# Patient Record
Sex: Female | Born: 1995 | Race: Black or African American | Hispanic: Refuse to answer | Marital: Single | Smoking: Current some day smoker
Health system: Southern US, Community
[De-identification: ages and names within clinical notes are randomized; demographics above are authoritative.]

## PROBLEM LIST (undated history)

## (undated) DIAGNOSIS — M419 Scoliosis, unspecified: Secondary | ICD-10-CM

## (undated) DIAGNOSIS — O24419 Gestational diabetes mellitus in pregnancy, unspecified control: Secondary | ICD-10-CM

## (undated) HISTORY — PX: NO PAST SURGERIES: SHX2092

## (undated) HISTORY — DX: Scoliosis, unspecified: M41.9

## (undated) HISTORY — DX: Gestational diabetes mellitus in pregnancy, unspecified control: O24.419

---

## 2016-04-05 DIAGNOSIS — B009 Herpesviral infection, unspecified: Secondary | ICD-10-CM

## 2016-04-05 HISTORY — DX: Herpesviral infection, unspecified: B00.9

## 2017-04-05 NOTE — L&D Delivery Note (Signed)
Delivery Note At 9:40 PM a viable and healthy female was delivered via Vaginal, Spontaneous (Presentation: ROA ).  APGAR: 8, 9; weight pending .   Placenta status: spontaneous, intact  .  Cord:  3 vessel with the following complications: none .    Anesthesia:  epidural Episiotomy:  none Lacerations:  none Suture Repair: n/a Est. Blood Loss (mL):  50   Mom to postpartum.  Baby to Couplet care / Skin to Skin.  Sierra Daniels is a 22 y.o. female G1P0 with IUP at [redacted]w[redacted]d admitted for IOL for GDM on metformin .  She progressed with augmentation (Cytotec, Foley Bulb, and Pitocin) to complete and pushed ~30 minutes to deliver.  Cord clamping delayed by 1-3 minutes then clamped by CNM and cut by pt father.  Placenta intact and spontaneous, bleeding minimal.  Intact perineum.  Mom and baby stable prior to transfer to postpartum. She plans on breastfeeding. She requests IUD for birth control.   Fatima Blank 01/27/2018, 9:56 PM

## 2017-07-08 ENCOUNTER — Encounter: Payer: Self-pay | Admitting: *Deleted

## 2017-08-16 ENCOUNTER — Encounter: Payer: Self-pay | Admitting: Obstetrics and Gynecology

## 2017-08-16 ENCOUNTER — Ambulatory Visit (INDEPENDENT_AMBULATORY_CARE_PROVIDER_SITE_OTHER): Payer: Medicaid Other | Admitting: Obstetrics and Gynecology

## 2017-08-16 ENCOUNTER — Other Ambulatory Visit (HOSPITAL_COMMUNITY)
Admission: RE | Admit: 2017-08-16 | Discharge: 2017-08-16 | Disposition: A | Discharge status: Home / Self Care | Payer: Medicaid Other | Source: Ambulatory Visit | Attending: Obstetrics and Gynecology | Admitting: Obstetrics and Gynecology

## 2017-08-16 ENCOUNTER — Ambulatory Visit (INDEPENDENT_AMBULATORY_CARE_PROVIDER_SITE_OTHER): Payer: Self-pay | Admitting: Clinical

## 2017-08-16 VITALS — BP 116/69 | HR 74 | Ht 67.0 in | Wt 133.1 lb

## 2017-08-16 DIAGNOSIS — F4321 Adjustment disorder with depressed mood: Secondary | ICD-10-CM

## 2017-08-16 DIAGNOSIS — Z3490 Encounter for supervision of normal pregnancy, unspecified, unspecified trimester: Secondary | ICD-10-CM | POA: Insufficient documentation

## 2017-08-16 DIAGNOSIS — Z3402 Encounter for supervision of normal first pregnancy, second trimester: Secondary | ICD-10-CM | POA: Diagnosis not present

## 2017-08-16 DIAGNOSIS — F329 Major depressive disorder, single episode, unspecified: Secondary | ICD-10-CM

## 2017-08-16 DIAGNOSIS — R4589 Other symptoms and signs involving emotional state: Secondary | ICD-10-CM | POA: Insufficient documentation

## 2017-08-16 LAB — POCT URINALYSIS DIP (DEVICE)
Bilirubin Urine: NEGATIVE
Glucose, UA: NEGATIVE mg/dL
HGB URINE DIPSTICK: NEGATIVE
KETONES UR: NEGATIVE mg/dL
Leukocytes, UA: NEGATIVE
Nitrite: NEGATIVE
PH: 5.5 (ref 5.0–8.0)
Protein, ur: NEGATIVE mg/dL
SPECIFIC GRAVITY, URINE: 1.025 (ref 1.005–1.030)
Urobilinogen, UA: 0.2 mg/dL (ref 0.0–1.0)

## 2017-08-16 NOTE — Progress Notes (Signed)
New OB Note  08/16/2017   CC:  Chief Complaint  Patient presents with  . Initial Prenatal Visit    Transfer of Care Patient: no  History of Present Illness: Sierra Daniels is a 22 y.o. G1P0 at [redacted]w[redacted]d by LMP being seen today for her first obstetrical visit. Her obstetrical history is significant for none. This was a planned pregnancy. Relationship with FOB: together; live seperate. Patient does intend to breast feed. Patient plans to do Depo for contraception after completion of pregnancy. Pregnancy history fully reviewed.  Her periods were: regular periods She was using no method when she conceived.  She has Negative signs or symptoms of nausea/vomiting of pregnancy. She has Negative signs or symptoms of miscarriage or preterm labor  Patient reports no complaints. Does stater that since finding out she was pregnant she has been more depressed. Denies SI/HI.  ROS: A 12-point review of systems was performed and negative, except as stated in the above HPI.   OBGYN History: OB History  Gravida Para Term Preterm AB Living  1         0  SAB TAB Ectopic Multiple Live Births               # Outcome Date GA Lbr Len/2nd Weight Sex Delivery Anes PTL Lv  1 Current             GYN Hx: none  Past Medical History: History reviewed. No pertinent past medical history.  Past Surgical History: History reviewed. No pertinent surgical history.  Family History:  History reviewed. No pertinent family history.  Social History:  Social History   Socioeconomic History  . Marital status: Single    Spouse name: Not on file  . Number of children: Not on file  . Years of education: Not on file  . Highest education level: Not on file  Occupational History  . Not on file  Social Needs  . Financial resource strain: Not on file  . Food insecurity:    Worry: Not on file    Inability: Not on file  . Transportation needs:    Medical: Not on file    Non-medical: Not on file  Tobacco Use    . Smoking status: Former Smoker    Types: Cigars    Last attempt to quit: 08/02/2017    Years since quitting: 0.0  . Smokeless tobacco: Never Used  Substance and Sexual Activity  . Alcohol use: Never    Frequency: Never  . Drug use: Never  . Sexual activity: Yes    Birth control/protection: Injection  Lifestyle  . Physical activity:    Days per week: Not on file    Minutes per session: Not on file  . Stress: Not on file  Relationships  . Social connections:    Talks on phone: Not on file    Gets together: Not on file    Attends religious service: Not on file    Active member of club or organization: Not on file    Attends meetings of clubs or organizations: Not on file    Relationship status: Not on file  . Intimate partner violence:    Fear of current or ex partner: Not on file    Emotionally abused: Not on file    Physically abused: Not on file    Forced sexual activity: Not on file  Other Topics Concern  . Not on file  Social History Narrative  . Not on file  Allergy: Allergies not on file  Current Outpatient Medications: No current outpatient medications on file.  Physical Exam:   BP 116/69   Pulse 74   Ht 5\' 7"  (1.702 m)   Wt 133 lb 1.6 oz (60.4 kg)   LMP 04/28/2017 (Exact Date)   BMI 20.85 kg/m  Body mass index is 20.85 kg/m. Contractions: Not present Vag. Bleeding: None. FHTs: 153  General appearance: Well nourished, well developed thin female in no acute distress.  Neck:  Supple, normal appearance, and no thyromegaly  Cardiovascular: normal rate and rhythm, pulses intact Respiratory:  Clear to auscultation bilateral. Normal respiratory effort Abdomen: positive bowel sounds and no masses, hernias; diffusely non tender to palpation, non distended Breasts: breasts appear normal, no suspicious masses, no skin or nipple changes or axillary nodes. Genitalia:  Normal introitus for age, no external lesions, no vaginal discharge, mucosa pink and moist,  no vaginal or cervical lesions, no vaginal atrophy, no friaility or hemorrhage, normal uterus size and position, no adnexal masses or tenderness Neuro/Psych:  Normal mood and affect.  Skin:  Warm and dry.  Lymphatic:  No inguinal lymphadenopathy.    Assessment/Plan: G1P0 [redacted]w[redacted]d  1. Encounter for supervision of low-risk pregnancy, antepartum - [facility] AMB BABYSCRIPTS SCHEDULE OPTIMIZATION - Culture, OB Urine - Cystic fibrosis gene test - Cytology - PAP - Genetic Screening - Hemoglobinopathy Evaluation - Obstetric Panel, Including HIV - SMN1 COPY NUMBER ANALYSIS (SMA Carrier Screen) - [place] MFM OB COMP + 14 WK; Future  2. Depressed mood Score today 11. Patient declines medication at this time. States her feelings of depression are more situational. Willing to meet with Roselyn Reef. Handoff given.   Initial labs drawn. Continue prenatal vitamins. Genetic Screening discussed, NIPS: ordered. Ultrasound discussed; fetal anatomic survey: ordered. Problem list reviewed and updated. The nature of [facility] with multiple MDs and other Advanced Practice Providers was explained to patient; also emphasized that residents, students are part of our team. Routine obstetric precautions reviewed. Return in about 1 month (around 09/13/2017) for ob visit.    Luiz Blare, DO OB Fellow Center for [facility], [facility]

## 2017-08-16 NOTE — Progress Notes (Addendum)
New ob packet given  Declined Flu vaccine  Anatomy US scheduled  Home Medicaid Form Completed

## 2017-08-16 NOTE — BH Specialist Note (Signed)
Integrated Behavioral Health Initial Visit  MRN: [id number] Name: Cereniti Curb  Number of [facility] Clinician visits:: 1/6 Session Start time: 9:49 Session End time: 10:20 Total time: 20 minutes  Type of Service: [place] Interpretor:No. Interpretor Name and Language: n/a   Warm Hand Off Completed.       SUBJECTIVE: Daurice Ovando is a 22 y.o. female accompanied by n/a Patient was referred by Dr Gerarda Fraction for depression Patient reports the following symptoms/concerns: Pt states her primary concern today is adjusting to a lack of sleep schedule after quitting her job, feeling irritable, and worry over mild feelings of depression. Pt is interested in talking about her feelings today and preventing depression postpartum.  Duration of problem: Current pregnancy; Severity of problem: moderate  OBJECTIVE: Mood: Normal and Affect: Appropriate Risk of harm to self orl: No plan to harm self or others  LIFE CONTEXT: Family and Social: Pt lives with her parents; FOB and parents are supportive School/Work: Looking for work Self-Care: - Life Changes: Current pregnancy; quit job  GOALS ADDRESSED: Patient will: 1. Reduce symptoms of: depression 2. Increase knowledge and/or ability of: coping skills  3. Demonstrate ability to: Increase healthy adjustment to current life circumstances  INTERVENTIONS: Interventions utilized: Solution-Focused Strategies and Psychoeducation and/or Health Education  Standardized Assessments completed: GAD-7 and PHQ 9  ASSESSMENT: Patient currently experiencing Adjustment disorder with depressed mood.   Patient may benefit from psychoeducation and brief therapeutic interventions regarding coping with symptoms of depression .  PLAN: 1. Follow up with behavioral health clinician on : One month 2. Behavioral recommendations:  -Continue using sleep sounds at night (same time every night) -Continue taking  prenatal vitamin daily and eating when hungry -Read educational materials regarding coping with symptoms of depression  3. Referral(s): [facility] (In Clinic) 4. "From scale of 1-10, how likely are you to follow plan?": 10  Garlan Fair, LCSW  Depression screen [facility] 2/9 08/16/2017  Decreased Interest 2  Down, Depressed, Hopeless 2  PHQ - 2 Score 4  Altered sleeping 3  Tired, decreased energy 1  Change in appetite 1  Feeling bad or failure about yourself  2  Trouble concentrating 0  Moving slowly or fidgety/restless 0  Suicidal thoughts 0  PHQ-9 Score 11   GAD 7 : Generalized Anxiety Score 08/16/2017  Nervous, Anxious, on Edge 0  Control/stop worrying 1  Worry too much - different things 1  Trouble relaxing 0  Restless 0  Easily annoyed or irritable 3  Afraid - awful might happen 0  Total GAD 7 Score 5

## 2017-08-16 NOTE — Patient Instructions (Signed)
Second Trimester of Pregnancy The second trimester is from week 13 through week 28, month 4 through 6. This is often the time in pregnancy that you feel your best. Often times, morning sickness has lessened or quit. You may have more energy, and you may get hungry more often. Your unborn baby (fetus) is growing rapidly. At the end of the sixth month, he or she is about 9 inches long and weighs about 1 pounds. You will likely feel the baby move (quickening) between 18 and 20 weeks of pregnancy. Follow these instructions at home:  Avoid all smoking, herbs, and alcohol. Avoid drugs not approved by your doctor.  Do not use any tobacco products, including cigarettes, chewing tobacco, and electronic cigarettes. If you need help quitting, ask your doctor. You may get counseling or other support to help you quit.  Only take medicine as told by your doctor. Some medicines are safe and some are not during pregnancy.  Exercise only as told by your doctor. Stop exercising if you start having cramps.  Eat regular, healthy meals.  Wear a good support bra if your breasts are tender.  Do not use hot tubs, steam rooms, or saunas.  Wear your seat belt when driving.  Avoid raw meat, uncooked cheese, and liter boxes and soil used by cats.  Take your prenatal vitamins.  Take 1500-2000 milligrams of calcium daily starting at the 20th week of pregnancy until you deliver your baby.  Try taking medicine that helps you poop (stool softener) as needed, and if your doctor approves. Eat more fiber by eating fresh fruit, vegetables, and whole grains. Drink enough fluids to keep your pee (urine) clear or pale yellow.  Take warm water baths (sitz baths) to soothe pain or discomfort caused by hemorrhoids. Use hemorrhoid cream if your doctor approves.  If you have puffy, bulging veins (varicose veins), wear support hose. Raise (elevate) your feet for 15 minutes, 3-4 times a day. Limit salt in your diet.  Avoid heavy  lifting, wear low heals, and sit up straight.  Rest with your legs raised if you have leg cramps or low back pain.  Visit your dentist if you have not gone during your pregnancy. Use a soft toothbrush to brush your teeth. Be gentle when you floss.  You can have sex (intercourse) unless your doctor tells you not to.  Go to your doctor visits. Get help if:  You feel dizzy.  You have mild cramps or pressure in your lower belly (abdomen).  You have a nagging pain in your belly area.  You continue to feel sick to your stomach (nauseous), throw up (vomit), or have watery poop (diarrhea).  You have bad smelling fluid coming from your vagina.  You have pain with peeing (urination). Get help right away if:  You have a fever.  You are leaking fluid from your vagina.  You have spotting or bleeding from your vagina.  You have severe belly cramping or pain.  You lose or gain weight rapidly.  You have trouble catching your breath and have chest pain.  You notice sudden or extreme puffiness (swelling) of your face, hands, ankles, feet, or legs.  You have not felt the baby move in over an hour.  You have severe headaches that do not go away with medicine.  You have vision changes. This information is not intended to replace advice given to you by your health care provider. Make sure you discuss any questions you have with your health care  provider. Document Released: 06/16/2009 Document Revised: 08/28/2015 Document Reviewed: 05/23/2012 Elsevier Interactive Patient Education  2017 [employer]???  You must attend a Doren Custard class at [facility]  3rd Wednesday of every month from [employer] by calling [phone] or online at [email or web address].at  Bring [place] the certificate from the class  Doren Custard supplies needed for [facility] Department patients:  Our practice has a Heritage manager in a Box tub at the hospital that  you can borrow  You will need to purchase an accessory kit that has all needed supplies through [facility] [phone]) or online $175.00  Or you can purchase the supplies separately: o Single-use disposable tub liner for Birth Pool in a Box (REGULAR size) o New garden hose labeled "lead-free", "suitable for drinking water", o Electric drain pump to remove water (We recommend 792 gallon per hour or greater pump.)  o  "non-toxic" OR "water potable" o Garden hose to remove the dirty water o Fish net o Bathing suit top (optional) o Long-handled mirror (optional)  [employer] sells tubs for ~ $120 if you would rather purchase your own tub.  They also sell accessories, liners.    Www.waterbirthsolutions.com for tub purchases and supplies  The Labor Ladies (www.thelaborladies.com) $275 for tub rental/set-up & take down/kit   [employer] information regarding doulas (labor support) who provide pool rentals:  IdentityList.se.htm   The Labor Ladies (www.thelaborladies.com)  IdentityList.se.htm   Things that would prevent you from having a waterbirth:  Premature, <37wks  Previous cesarean birth  Presence of thick meconium-stained fluid  Multiple gestation (Twins, triplets, etc.)  Uncontrolled diabetes or gestational diabetes requiring medication  Hypertension  Heavy vaginal bleeding  Non-reassuring fetal heart rate  Active infection (MRSA, etc.)  If your labor has to be induced and induction method requires continuous monitoring of the baby's heart rate  Other risks/issues identified by your obstetrical provider

## 2017-08-17 LAB — CYTOLOGY - PAP
CHLAMYDIA, DNA PROBE: NEGATIVE
Diagnosis: NEGATIVE
Neisseria Gonorrhea: NEGATIVE

## 2017-08-19 LAB — CULTURE, OB URINE

## 2017-08-19 LAB — URINE CULTURE, OB REFLEX

## 2017-08-22 LAB — SMN1 COPY NUMBER ANALYSIS (SMA CARRIER SCREENING)

## 2017-08-22 LAB — OBSTETRIC PANEL, INCLUDING HIV
Antibody Screen: NEGATIVE
Basophils Absolute: 0 10*3/uL (ref 0.0–0.2)
Basos: 0 %
EOS (ABSOLUTE): 0.1 10*3/uL (ref 0.0–0.4)
EOS: 2 %
HEMOGLOBIN: 12 g/dL (ref 11.1–15.9)
HEP B S AG: NEGATIVE
HIV SCREEN 4TH GENERATION: NONREACTIVE
Hematocrit: 35.9 % (ref 34.0–46.6)
IMMATURE GRANULOCYTES: 0 %
Immature Grans (Abs): 0 10*3/uL (ref 0.0–0.1)
Lymphocytes Absolute: 1.8 10*3/uL (ref 0.7–3.1)
Lymphs: 33 %
MCH: 28.8 pg (ref 26.6–33.0)
MCHC: 33.4 g/dL (ref 31.5–35.7)
MCV: 86 fL (ref 79–97)
MONOS ABS: 0.4 10*3/uL (ref 0.1–0.9)
Monocytes: 8 %
NEUTROS ABS: 3.1 10*3/uL (ref 1.4–7.0)
NEUTROS PCT: 57 %
PLATELETS: 217 10*3/uL (ref 150–379)
RBC: 4.16 x10E6/uL (ref 3.77–5.28)
RDW: 13.3 % (ref 12.3–15.4)
RH TYPE: POSITIVE
RPR: NONREACTIVE
Rubella Antibodies, IGG: 21.4 index (ref >0.99)
WBC: 5.5 10*3/uL (ref 3.4–10.8)

## 2017-08-22 LAB — HEMOGLOBINOPATHY EVALUATION
Ferritin: 25 ng/mL (ref 15–150)
HGB A2 QUANT: 2.5 % (ref 1.8–3.2)
HGB F QUANT: 0 % (ref 0.0–2.0)
HGB S: 0 %
HGB VARIANT: 0 %
Hgb A: 97.5 % (ref 96.4–98.8)
Hgb C: 0 %
Hgb Solubility: NEGATIVE

## 2017-08-23 LAB — CYSTIC FIBROSIS GENE TEST

## 2017-08-26 ENCOUNTER — Encounter: Payer: Self-pay | Admitting: *Deleted

## 2017-08-30 ENCOUNTER — Encounter: Payer: Self-pay | Admitting: *Deleted

## 2017-09-01 ENCOUNTER — Encounter (HOSPITAL_COMMUNITY): Payer: Self-pay

## 2017-09-09 ENCOUNTER — Ambulatory Visit (HOSPITAL_COMMUNITY)
Admission: RE | Admit: 2017-09-09 | Discharge: 2017-09-09 | Disposition: A | Discharge status: Home / Self Care | Payer: Medicaid Other | Source: Ambulatory Visit | Attending: Obstetrics and Gynecology | Admitting: Obstetrics and Gynecology

## 2017-09-09 ENCOUNTER — Other Ambulatory Visit: Payer: Self-pay | Admitting: Obstetrics and Gynecology

## 2017-09-09 DIAGNOSIS — Z3490 Encounter for supervision of normal pregnancy, unspecified, unspecified trimester: Secondary | ICD-10-CM

## 2017-09-09 DIAGNOSIS — D259 Leiomyoma of uterus, unspecified: Secondary | ICD-10-CM | POA: Diagnosis not present

## 2017-09-09 DIAGNOSIS — O3412 Maternal care for benign tumor of corpus uteri, second trimester: Secondary | ICD-10-CM | POA: Diagnosis not present

## 2017-09-09 DIAGNOSIS — Z3A19 19 weeks gestation of pregnancy: Secondary | ICD-10-CM | POA: Insufficient documentation

## 2017-09-09 DIAGNOSIS — Z363 Encounter for antenatal screening for malformations: Secondary | ICD-10-CM

## 2017-09-13 ENCOUNTER — Ambulatory Visit (INDEPENDENT_AMBULATORY_CARE_PROVIDER_SITE_OTHER): Payer: Medicaid Other | Admitting: Nurse Practitioner

## 2017-09-13 VITALS — BP 113/61 | HR 88 | Wt 143.6 lb

## 2017-09-13 DIAGNOSIS — Z3402 Encounter for supervision of normal first pregnancy, second trimester: Secondary | ICD-10-CM

## 2017-09-13 DIAGNOSIS — Z3490 Encounter for supervision of normal pregnancy, unspecified, unspecified trimester: Secondary | ICD-10-CM

## 2017-09-13 MED ORDER — VITAFOL GUMMIES 3.33-0.333-34.8 MG PO CHEW: 3.0000 | CHEWABLE_TABLET | Freq: Every day | ORAL | 11 refills | Status: AC

## 2017-09-13 NOTE — Progress Notes (Signed)
    Subjective:  Sierra Daniels is a 22 y.o. G1P0 at [redacted]w[redacted]d being seen today for ongoing prenatal care.  She is currently monitored for the following issues for this low-risk pregnancy and has Supervision of low-risk pregnancy and Depressed mood on their problem list.    Patient reports no complaints.  Contractions: Not present. Vag. Bleeding: None.  Movement: Present. Denies leaking of fluid.   Has vomited occasionally but not daily.  The following portions of the patient's history were reviewed and updated as appropriate: allergies, current medications, past family history, past medical history, past social history, past surgical history and problem list. Problem list updated.  Objective:   Vitals:   09/13/17 1647  BP: 113/61  Pulse: 88  Weight: 143 lb 9.6 oz (65.1 kg)    Fetal Status: Fetal Heart Rate (bpm): 160 Fundal Height: 23 cm Movement: Present     General:  Alert, oriented and cooperative. Patient is in no acute distress.  Skin: Skin is warm and dry. No rash noted.   Cardiovascular: Normal heart rate noted  Respiratory: Normal respiratory effort, no problems with respiration noted  Abdomen: Soft, gravid, appropriate for gestational age. Pain/Pressure: Present     Pelvic:  Cervical exam deferred        Extremities: Normal range of motion.  Edema: None  Mental Status: Normal mood and affect. Normal behavior. Normal judgment and thought content.   Urinalysis:      Assessment and Plan:  Pregnancy: G1P0 at [redacted]w[redacted]d  1. Encounter for supervision of low-risk pregnancy, antepartum Refilled prenatal vitamins Discussed eating every 3-4 hours to avoid nausea and vomiting. Drink at least 8 8-oz glasses of water every day. Has list of medications that she can take in pregnancy  Preterm labor symptoms and general obstetric precautions including but not limited to vaginal bleeding, contractions, leaking of fluid and fetal movement were reviewed in detail with the patient. Please refer to  After Visit Summary for other counseling recommendations.  Return in about 1 month (around 10/11/2017).  Earlie Server, RN, MSN, NP-BC Nurse Practitioner, [facility] for [employer], [place] [facility] 09/13/2017 5:09 PM

## 2017-09-13 NOTE — Patient Instructions (Signed)
Second Trimester of Pregnancy The second trimester is from week 13 through week 28, month 4 through 6. This is often the time in pregnancy that you feel your best. Often times, morning sickness has lessened or quit. You may have more energy, and you may get hungry more often. Your unborn baby (fetus) is growing rapidly. At the end of the sixth month, he or she is about 9 inches long and weighs about 1 pounds. You will likely feel the baby move (quickening) between 18 and 20 weeks of pregnancy. Follow these instructions at home:  Avoid all smoking, herbs, and alcohol. Avoid drugs not approved by your doctor.  Do not use any tobacco products, including cigarettes, chewing tobacco, and electronic cigarettes. If you need help quitting, ask your doctor. You may get counseling or other support to help you quit.  Only take medicine as told by your doctor. Some medicines are safe and some are not during pregnancy.  Exercise only as told by your doctor. Stop exercising if you start having cramps.  Eat regular, healthy meals.  Wear a good support bra if your breasts are tender.  Do not use hot tubs, steam rooms, or saunas.  Wear your seat belt when driving.  Avoid raw meat, uncooked cheese, and liter boxes and soil used by cats.  Take your prenatal vitamins.  Take 1500-2000 milligrams of calcium daily starting at the 20th week of pregnancy until you deliver your baby.  Try taking medicine that helps you poop (stool softener) as needed, and if your doctor approves. Eat more fiber by eating fresh fruit, vegetables, and whole grains. Drink enough fluids to keep your pee (urine) clear or pale yellow.  Take warm water baths (sitz baths) to soothe pain or discomfort caused by hemorrhoids. Use hemorrhoid cream if your doctor approves.  If you have puffy, bulging veins (varicose veins), wear support hose. Raise (elevate) your feet for 15 minutes, 3-4 times a day. Limit salt in your diet.  Avoid heavy  lifting, wear low heals, and sit up straight.  Rest with your legs raised if you have leg cramps or low back pain.  Visit your dentist if you have not gone during your pregnancy. Use a soft toothbrush to brush your teeth. Be gentle when you floss.  You can have sex (intercourse) unless your doctor tells you not to.  Go to your doctor visits. Get help if:  You feel dizzy.  You have mild cramps or pressure in your lower belly (abdomen).  You have a nagging pain in your belly area.  You continue to feel sick to your stomach (nauseous), throw up (vomit), or have watery poop (diarrhea).  You have bad smelling fluid coming from your vagina.  You have pain with peeing (urination). Get help right away if:  You have a fever.  You are leaking fluid from your vagina.  You have spotting or bleeding from your vagina.  You have severe belly cramping or pain.  You lose or gain weight rapidly.  You have trouble catching your breath and have chest pain.  You notice sudden or extreme puffiness (swelling) of your face, hands, ankles, feet, or legs.  You have not felt the baby move in over an hour.  You have severe headaches that do not go away with medicine.  You have vision changes. This information is not intended to replace advice given to you by your health care provider. Make sure you discuss any questions you have with your health care   provider. Document Released: 06/16/2009 Document Revised: 08/28/2015 Document Reviewed: 05/23/2012 Elsevier Interactive Patient Education  2017 Elsevier Inc.  

## 2017-09-14 ENCOUNTER — Telehealth: Payer: Self-pay

## 2017-09-14 NOTE — Telephone Encounter (Signed)
Called patient - advised [place] MFM Follow up scheduled for 10/10/17 @ 8 am

## 2017-10-10 ENCOUNTER — Ambulatory Visit (HOSPITAL_COMMUNITY): Admission: RE | Admit: 2017-10-10 | Payer: Medicaid Other | Source: Ambulatory Visit

## 2017-10-13 ENCOUNTER — Other Ambulatory Visit: Payer: Self-pay | Admitting: Nurse Practitioner

## 2017-10-13 ENCOUNTER — Ambulatory Visit (HOSPITAL_COMMUNITY)
Admission: RE | Admit: 2017-10-13 | Discharge: 2017-10-13 | Disposition: A | Discharge status: Home / Self Care | Payer: Medicaid Other | Source: Ambulatory Visit | Attending: Nurse Practitioner | Admitting: Nurse Practitioner

## 2017-10-13 DIAGNOSIS — Z3A24 24 weeks gestation of pregnancy: Secondary | ICD-10-CM

## 2017-10-13 DIAGNOSIS — Z362 Encounter for other antenatal screening follow-up: Secondary | ICD-10-CM

## 2017-10-13 DIAGNOSIS — Z0489 Encounter for examination and observation for other specified reasons: Secondary | ICD-10-CM

## 2017-10-13 DIAGNOSIS — Z3490 Encounter for supervision of normal pregnancy, unspecified, unspecified trimester: Secondary | ICD-10-CM

## 2017-10-13 DIAGNOSIS — IMO0002 Reserved for concepts with insufficient information to code with codable children: Secondary | ICD-10-CM

## 2017-11-16 ENCOUNTER — Ambulatory Visit: Payer: Medicaid Other | Admitting: Medical

## 2017-11-17 ENCOUNTER — Encounter: Payer: Self-pay | Admitting: Medical

## 2017-11-17 ENCOUNTER — Encounter: Payer: Self-pay | Admitting: Obstetrics and Gynecology

## 2017-11-17 ENCOUNTER — Ambulatory Visit (INDEPENDENT_AMBULATORY_CARE_PROVIDER_SITE_OTHER): Payer: Medicaid Other | Admitting: Obstetrics and Gynecology

## 2017-11-17 ENCOUNTER — Telehealth: Payer: Self-pay | Admitting: Medical

## 2017-11-17 VITALS — BP 118/66 | HR 75 | Wt 167.0 lb

## 2017-11-17 DIAGNOSIS — Z3490 Encounter for supervision of normal pregnancy, unspecified, unspecified trimester: Secondary | ICD-10-CM

## 2017-11-17 DIAGNOSIS — Z23 Encounter for immunization: Secondary | ICD-10-CM

## 2017-11-17 DIAGNOSIS — Z3492 Encounter for supervision of normal pregnancy, unspecified, second trimester: Secondary | ICD-10-CM

## 2017-11-17 NOTE — Patient Instructions (Signed)
Third Trimester of Pregnancy The third trimester is from week 28 through week 40 (months 7 through 9). The third trimester is a time when the unborn baby (fetus) is growing rapidly. At the end of the ninth month, the fetus is about 20 inches in length and weighs 6-10 pounds. Body changes during your third trimester Your body will continue to go through many changes during pregnancy. The changes vary from woman to woman. During the third trimester:  Your weight will continue to increase. You can expect to gain 25-35 pounds (11-16 kg) by the end of the pregnancy.  You may begin to get stretch marks on your hips, abdomen, and breasts.  You may urinate more often because the fetus is moving lower into your pelvis and pressing on your bladder.  You may develop or continue to have heartburn. This is caused by increased hormones that slow down muscles in the digestive tract.  You may develop or continue to have constipation because increased hormones slow digestion and cause the muscles that push waste through your intestines to relax.  You may develop hemorrhoids. These are swollen veins (varicose veins) in the rectum that can itch or be painful.  You may develop swollen, bulging veins (varicose veins) in your legs.  You may have increased body aches in the pelvis, back, or thighs. This is due to weight gain and increased hormones that are relaxing your joints.  You may have changes in your hair. These can include thickening of your hair, rapid growth, and changes in texture. Some women also have hair loss during or after pregnancy, or hair that feels dry or thin. Your hair will most likely return to normal after your baby is born.  Your breasts will continue to grow and they will continue to become tender. A yellow fluid (colostrum) may leak from your breasts. This is the first milk you are producing for your baby.  Your belly button may stick out.  You may notice more swelling in your hands,  face, or ankles.  You may have increased tingling or numbness in your hands, arms, and legs. The skin on your belly may also feel numb.  You may feel short of breath because of your expanding uterus.  You may have more problems sleeping. This can be caused by the size of your belly, increased need to urinate, and an increase in your body's metabolism.  You may notice the fetus "dropping," or moving lower in your abdomen (lightening).  You may have increased vaginal discharge.  You may notice your joints feel loose and you may have pain around your pelvic bone.  What to expect at prenatal visits You will have prenatal exams every 2 weeks until week 36. Then you will have weekly prenatal exams. During a routine prenatal visit:  You will be weighed to make sure you and the baby are growing normally.  Your blood pressure will be taken.  Your abdomen will be measured to track your baby's growth.  The fetal heartbeat will be listened to.  Any test results from the previous visit will be discussed.  You may have a cervical check near your due date to see if your cervix has softened or thinned (effaced).  You will be tested for Group B streptococcus. This happens between 35 and 37 weeks.  Your health care provider may ask you:  What your birth plan is.  How you are feeling.  If you are feeling the baby move.  If you have had   any abnormal symptoms, such as leaking fluid, bleeding, severe headaches, or abdominal cramping.  If you are using any tobacco products, including cigarettes, chewing tobacco, and electronic cigarettes.  If you have any questions.  Other tests or screenings that may be performed during your third trimester include:  Blood tests that check for low iron levels (anemia).  Fetal testing to check the health, activity level, and growth of the fetus. Testing is done if you have certain medical conditions or if there are problems during the  pregnancy.  Nonstress test (NST). This test checks the health of your baby to make sure there are no signs of problems, such as the baby not getting enough oxygen. During this test, a belt is placed around your belly. The baby is made to move, and its heart rate is monitored during movement.  What is false labor? False labor is a condition in which you feel small, irregular tightenings of the muscles in the womb (contractions) that usually go away with rest, changing position, or drinking water. These are called Braxton Hicks contractions. Contractions may last for hours, days, or even weeks before true labor sets in. If contractions come at regular intervals, become more frequent, increase in intensity, or become painful, you should see your health care provider. What are the signs of labor?  Abdominal cramps.  Regular contractions that start at 10 minutes apart and become stronger and more frequent with time.  Contractions that start on the top of the uterus and spread down to the lower abdomen and back.  Increased pelvic pressure and dull back pain.  A watery or bloody mucus discharge that comes from the vagina.  Leaking of amniotic fluid. This is also known as your "water breaking." It could be a slow trickle or a gush. Let your health care provider know if it has a color or strange odor. If you have any of these signs, call your health care provider right away, even if it is before your due date. Follow these instructions at home: Medicines  Follow your health care provider's instructions regarding medicine use. Specific medicines may be either safe or unsafe to take during pregnancy.  Take a prenatal vitamin that contains at least 600 micrograms (mcg) of folic acid.  If you develop constipation, try taking a stool softener if your health care provider approves. Eating and drinking  Eat a balanced diet that includes fresh fruits and vegetables, whole grains, good sources of protein  such as meat, eggs, or tofu, and low-fat dairy. Your health care provider will help you determine the amount of weight gain that is right for you.  Avoid raw meat and uncooked cheese. These carry germs that can cause birth defects in the baby.  If you have low calcium intake from food, talk to your health care provider about whether you should take a daily calcium supplement.  Eat four or five small meals rather than three large meals a day.  Limit foods that are high in fat and processed sugars, such as fried and sweet foods.  To prevent constipation: ? Drink enough fluid to keep your urine clear or pale yellow. ? Eat foods that are high in fiber, such as fresh fruits and vegetables, whole grains, and beans. Activity  Exercise only as directed by your health care provider. Most women can continue their usual exercise routine during pregnancy. Try to exercise for 30 minutes at least 5 days a week. Stop exercising if you experience uterine contractions.  Avoid heavy   lifting.  Do not exercise in extreme heat or humidity, or at high altitudes.  Wear low-heel, comfortable shoes.  Practice good posture.  You may continue to have sex unless your health care provider tells you otherwise. Relieving pain and discomfort  Take frequent breaks and rest with your legs elevated if you have leg cramps or low back pain.  Take warm sitz baths to soothe any pain or discomfort caused by hemorrhoids. Use hemorrhoid cream if your health care provider approves.  Wear a good support bra to prevent discomfort from breast tenderness.  If you develop varicose veins: ? Wear support pantyhose or compression stockings as told by your healthcare provider. ? Elevate your feet for 15 minutes, 3-4 times a day. Prenatal care  Write down your questions. Take them to your prenatal visits.  Keep all your prenatal visits as told by your health care provider. This is important. Safety  Wear your seat belt at  all times when driving.  Make a list of emergency phone numbers, including numbers for family, friends, the hospital, and police and fire departments. General instructions  Avoid cat litter boxes and soil used by cats. These carry germs that can cause birth defects in the baby. If you have a cat, ask someone to clean the litter box for you.  Do not travel far distances unless it is absolutely necessary and only with the approval of your health care provider.  Do not use hot tubs, steam rooms, or saunas.  Do not drink alcohol.  Do not use any products that contain nicotine or tobacco, such as cigarettes and e-cigarettes. If you need help quitting, ask your health care provider.  Do not use any medicinal herbs or unprescribed drugs. These chemicals affect the formation and growth of the baby.  Do not douche or use tampons or scented sanitary pads.  Do not cross your legs for long periods of time.  To prepare for the arrival of your baby: ? Take prenatal classes to understand, practice, and ask questions about labor and delivery. ? Make a trial run to the hospital. ? Visit the hospital and tour the maternity area. ? Arrange for maternity or paternity leave through employers. ? Arrange for family and friends to take care of pets while you are in the hospital. ? Purchase a rear-facing car seat and make sure you know how to install it in your car. ? Pack your hospital bag. ? Prepare the baby's nursery. Make sure to remove all pillows and stuffed animals from the baby's crib to prevent suffocation.  Visit your dentist if you have not gone during your pregnancy. Use a soft toothbrush to brush your teeth and be gentle when you floss. Contact a health care provider if:  You are unsure if you are in labor or if your water has broken.  You become dizzy.  You have mild pelvic cramps, pelvic pressure, or nagging pain in your abdominal area.  You have lower back pain.  You have persistent  nausea, vomiting, or diarrhea.  You have an unusual or bad smelling vaginal discharge.  You have pain when you urinate. Get help right away if:  Your water breaks before 37 weeks.  You have regular contractions less than 5 minutes apart before 37 weeks.  You have a fever.  You are leaking fluid from your vagina.  You have spotting or bleeding from your vagina.  You have severe abdominal pain or cramping.  You have rapid weight loss or weight gain.    You have shortness of breath with chest pain.  You notice sudden or extreme swelling of your face, hands, ankles, feet, or legs.  Your baby makes fewer than 10 movements in 2 hours.  You have severe headaches that do not go away when you take medicine.  You have vision changes. Summary  The third trimester is from week 28 through week 40, months 7 through 9. The third trimester is a time when the unborn baby (fetus) is growing rapidly.  During the third trimester, your discomfort may increase as you and your baby continue to gain weight. You may have abdominal, leg, and back pain, sleeping problems, and an increased need to urinate.  During the third trimester your breasts will keep growing and they will continue to become tender. A yellow fluid (colostrum) may leak from your breasts. This is the first milk you are producing for your baby.  False labor is a condition in which you feel small, irregular tightenings of the muscles in the womb (contractions) that eventually go away. These are called Braxton Hicks contractions. Contractions may last for hours, days, or even weeks before true labor sets in.  Signs of labor can include: abdominal cramps; regular contractions that start at 10 minutes apart and become stronger and more frequent with time; watery or bloody mucus discharge that comes from the vagina; increased pelvic pressure and dull back pain; and leaking of amniotic fluid. This information is not intended to replace advice  given to you by your health care provider. Make sure you discuss any questions you have with your health care provider. Document Released: 03/16/2001 Document Revised: 08/28/2015 Document Reviewed: 05/23/2012 Elsevier Interactive Patient Education  2017 Elsevier Inc.  

## 2017-11-17 NOTE — Progress Notes (Signed)
   PRENATAL VISIT NOTE  Subjective:  Sierra Daniels is a 22 y.o. G1P0 at [redacted]w[redacted]d being seen today for ongoing prenatal care.  She is currently monitored for the following issues for this low-risk pregnancy and has Supervision of low-risk pregnancy and Depressed mood on their problem list.  Patient reports no complaints.  Contractions: Not present. Vag. Bleeding: None.  Movement: Present. Denies leaking of fluid.   The following portions of the patient's history were reviewed and updated as appropriate: allergies, current medications, past family history, past medical history, past social history, past surgical history and problem list. Problem list updated.  Objective:   Vitals:   11/17/17 1610  BP: 118/66  Pulse: 75  Weight: 167 lb (75.8 kg)    Fetal Status: Fetal Heart Rate (bpm): 159   Movement: Present     General:  Alert, oriented and cooperative. Patient is in no acute distress.  Skin: Skin is warm and dry. No rash noted.   Cardiovascular: Normal heart rate noted  Respiratory: Normal respiratory effort, no problems with respiration noted  Abdomen: Soft, gravid, appropriate for gestational age.  Pain/Pressure: Present     Pelvic: Cervical exam deferred        Extremities: Normal range of motion.  Edema: Trace  Mental Status: Normal mood and affect. Normal behavior. Normal judgment and thought content.   Assessment and Plan:  Pregnancy: G1P0 at [redacted]w[redacted]d  1. Need for Tdap vaccination  - Tdap vaccine greater than or equal to 7yo IM  2. Encounter for supervision of low-risk pregnancy, antepartum Lapsed PNC. Not fasting. Will return this week for 2hr OGTT. Reviewed schedule for visits and pregnnacy precautions.   Preterm labor symptoms and general obstetric precautions including but not limited to vaginal bleeding, contractions, leaking of fluid and fetal movement were reviewed in detail with the patient. Please refer to After Visit Summary for other counseling recommendations.    Return in about 2 weeks (around 12/01/2017).  Future Appointments  Date Time Provider [place]  11/22/2017  8:20 AM [facility]    Khadeja Abt [place], [place]

## 2017-11-17 NOTE — Progress Notes (Signed)
Patient left without being seen. Did not tell anyone and was not seen by provider or RN.  Luvenia Redden, PA-C 11/17/2017 8:13 AM

## 2017-11-17 NOTE — Telephone Encounter (Signed)
Called patient to get her rescheduled for her appointment. I asked her why did she leave on 11/16/2017 without being seen. She stated she had something to do. I have rescheduled her for 11/17/2017.

## 2017-11-21 ENCOUNTER — Other Ambulatory Visit: Payer: Self-pay

## 2017-11-21 DIAGNOSIS — Z3490 Encounter for supervision of normal pregnancy, unspecified, unspecified trimester: Secondary | ICD-10-CM

## 2017-11-22 ENCOUNTER — Other Ambulatory Visit: Payer: Medicaid Other

## 2017-11-22 DIAGNOSIS — Z3490 Encounter for supervision of normal pregnancy, unspecified, unspecified trimester: Secondary | ICD-10-CM

## 2017-11-23 LAB — CBC
HEMATOCRIT: 36.6 % (ref 34.0–46.6)
Hemoglobin: 12 g/dL (ref 11.1–15.9)
MCH: 28.4 pg (ref 26.6–33.0)
MCHC: 32.8 g/dL (ref 31.5–35.7)
MCV: 87 fL (ref 79–97)
Platelets: 298 10*3/uL (ref 150–450)
RBC: 4.23 x10E6/uL (ref 3.77–5.28)
RDW: 13.2 % (ref 12.3–15.4)
WBC: 8.9 10*3/uL (ref 3.4–10.8)

## 2017-11-23 LAB — HIV ANTIBODY (ROUTINE TESTING W REFLEX): HIV Screen 4th Generation wRfx: NONREACTIVE

## 2017-11-23 LAB — GLUCOSE TOLERANCE, 2 HOURS W/ 1HR
GLUCOSE, 1 HOUR: 157 mg/dL (ref 65–179)
GLUCOSE, 2 HOUR: 116 mg/dL (ref 65–152)
GLUCOSE, FASTING: 111 mg/dL — AB (ref 65–91)

## 2017-11-23 LAB — RPR: RPR: NONREACTIVE

## 2017-11-28 ENCOUNTER — Other Ambulatory Visit: Payer: Self-pay | Admitting: *Deleted

## 2017-11-28 DIAGNOSIS — O24419 Gestational diabetes mellitus in pregnancy, unspecified control: Secondary | ICD-10-CM

## 2017-11-28 MED ORDER — ACCU-CHEK FASTCLIX LANCETS MISC: 12 refills | Status: DC

## 2017-11-28 MED ORDER — ACCU-CHEK FASTCLIX LANCET KIT: PACK | 0 refills | Status: DC

## 2017-11-28 MED ORDER — GLUCOSE BLOOD VI STRP: ORAL_STRIP | 12 refills | Status: DC

## 2017-11-28 MED ORDER — ACCU-CHEK COMPACT PLUS CARE KIT: PACK | 0 refills | Status: DC

## 2017-12-01 ENCOUNTER — Encounter: Payer: Medicaid Other | Attending: Obstetrics & Gynecology | Admitting: *Deleted

## 2017-12-01 ENCOUNTER — Ambulatory Visit: Payer: Medicaid Other | Admitting: *Deleted

## 2017-12-01 DIAGNOSIS — Z713 Dietary counseling and surveillance: Secondary | ICD-10-CM | POA: Diagnosis not present

## 2017-12-01 DIAGNOSIS — O24419 Gestational diabetes mellitus in pregnancy, unspecified control: Secondary | ICD-10-CM | POA: Insufficient documentation

## 2017-12-01 NOTE — Progress Notes (Signed)
  Patient was seen on 12/01/2017 for Gestational Diabetes self-management. EDD 02/02/2018. Patient states no history of GDM. Significant other, Marjory Lies, here and he participated in the visit. Diet history obtained. Patient eats fair variety of all food groups with larger than average portion sizes and frequent high calorie foods. Beverages include fruit punch, regular sodas @ several each day.  The following learning objectives were met by the patient :   States the definition of Gestational Diabetes  States why dietary management is important in controlling blood glucose  Describes the effects of carbohydrates on blood glucose levels  Demonstrates ability to create a balanced meal plan  Demonstrates carbohydrate counting   States when to check blood glucose levels  Demonstrates proper blood glucose monitoring techniques  States the effect of stress and exercise on blood glucose levels  States the importance of limiting caffeine and abstaining from alcohol and smoking  Plan:  Aim for 3-4 Carb Choices per meal (45 - 60grams)  Aim for 1-2 Carbs per snack Begin reading food labels for Total Carbohydrate of foods If OK with your MD, consider  increasing your activity level by walking, Arm Chair Exercises or other activity daily as tolerated Begin checking BG before breakfast and 2 hours after first bite of breakfast, lunch and dinner as directed by MD  Bring Log Book/Sheet to every medical appointment or use Baby Scripts Baby Scripts:  Patient was introduced to [employer] and plans to use as record of BG electronically  Take medication if directed by MD  Blood glucose monitor Rx called into pharmacy by MD office a few days ago. I informed patient that she can go pick it up now: Accu Check Guide with Fast Clix drums Patient instructed to test pre breakfast and 2 hours each meal as directed by MD  Patient instructed to monitor glucose levels: FBS: 60 - 95 mg/dl 2 hour: <120  mg/dl  Patient received the following handouts:  Nutrition Diabetes and Pregnancy  Carbohydrate Counting List  BG Log Sheet  Patient will be seen for follow-up as needed.

## 2017-12-04 DIAGNOSIS — O24415 Gestational diabetes mellitus in pregnancy, controlled by oral hypoglycemic drugs: Secondary | ICD-10-CM | POA: Insufficient documentation

## 2017-12-08 ENCOUNTER — Ambulatory Visit (INDEPENDENT_AMBULATORY_CARE_PROVIDER_SITE_OTHER): Payer: Medicaid Other | Admitting: Student

## 2017-12-08 DIAGNOSIS — Z3493 Encounter for supervision of normal pregnancy, unspecified, third trimester: Secondary | ICD-10-CM

## 2017-12-08 DIAGNOSIS — O24410 Gestational diabetes mellitus in pregnancy, diet controlled: Secondary | ICD-10-CM

## 2017-12-08 NOTE — Patient Instructions (Addendum)
Tips for Eating Away From Home If You Have Diabetes Controlling your level of blood glucose, also known as blood sugar, can be challenging. It can be even more difficult when you do not prepare your own meals. The following tips can help you manage your diabetes when you eat away from home. Planning ahead Plan ahead if you know you will be eating away from home:  Ask your health care provider how to time meals and medicine if you are taking insulin.  Make a list of restaurants near you that offer healthy choices. If they have a carry-out menu, take it home and plan what you will order ahead of time.  Look up the restaurant you want to eat at online. Many chain and fast-food restaurants list nutritional information online. Use this information to choose the healthiest options and to calculate how many carbohydrates will be in your meal.  Use a carbohydrate-counting book or mobile app to look up the carbohydrate content and serving size of the foods you want to eat.  Become familiar with serving sizes and learn to recognize how many servings are in a portion. This will allow you to estimate how many carbohydrates you can eat.  Free foods A "free food" is any food or drink that has less than 5 g of carbohydrates per serving. Free foods include:  Many vegetables.  Hard boiled eggs.  Nuts or seeds.  Olives.  Cheeses.  Meats.  These types of foods make good appetizer choices and are often available at salad bars. Lemon juice, vinegar, or a low-calorie salad dressing of fewer than 20 calories per serving can be used as a "free" salad dressing. Choices to reduce carbohydrates  Substitute nonfat sweetened yogurt with a sugar-free yogurt. Yogurt made from soy milk may also be used, but you will still want a sugar-free or plain option to choose a lower carbohydrate amount.  Ask your server to take away the bread basket or chips from your table.  Order fresh fruit. A salad bar often  offers fresh fruit choices. Avoid canned fruit because it is usually packed in sugar or syrup.  Order a salad, and eat it without dressing. Or, create a "free" salad dressing.  Ask for substitutions. For example, instead of [place] fries, request an order of a vegetable such as salad, green beans, or broccoli. Other tips  If you take insulin, take the insulin once your food arrives to your table. This will ensure your insulin and food are timed correctly.  Ask your server about the portion size before your order, and ask for a take-out box if the portion has more servings than you should have. When your food comes, leave the amount you should have on the plate, and put the rest in the take-out box.  Consider splitting an entree with someone and ordering a side salad. This information is not intended to replace advice given to you by your health care provider. Make sure you discuss any questions you have with your health care provider. Document Released: 03/22/2005 Document Revised: 08/28/2015 Document Reviewed: 06/19/2013 Elsevier Interactive Patient Education  2018 [place] [address] [place], [place]  [address] Phone - [phone]   Fax - [phone]  [facility] [address] [place], [place] [address] Phone - [phone]   Fax - [place] [address] [place], [place]  [address]  Phone - [phone]   Fax - [phone]  [facility] [address] [place] [address] [place], [place]  [address] Phone - [phone]   Fax - [phone]  [facility] [address] [place], [place]  [address] Phone - [phone]   Fax - [phone]  [facility] [address] [place], [place]  [address] Phone - [phone]   Fax - [place] [address] [place], [place]  [address] Phone - [phone]   Fax - [phone]  [place] [address] [place], [place] [address] [place], [place]  [address] Phone - [phone]   Fax - [place] [address] [place], [place]  [address] Phone - [phone]   Fax - [phone] [facility] [place]. [address] [place], [place]  [address] Phone - [phone]   Fax - [phone]  [facility] [place] [address] [place], [place]  [address] Phone - [phone]   Fax - [phone]  [facility] [place], [place], [place]  [address] Phone - [phone]   Fax - [place] [address] [place], [place]  [address] Phone - [phone]   Fax - [phone]  [facility] [address], [place], [place]  [address] Phone - [place], [place]  [address] Phone - [phone]   Fax - [place] [address] [place], [place]  [address] Phone - [phone]   Fax - [phone]  [place] [address] [place], [place]  [address] Phone - [phone]   Fax - [place]. [place], [place]  [address] Phone - [phone]   Fax - [place], [place], [place]  [address] Phone - [phone]   Fax - [place] [address] [place], [place]  [address] Phone - [phone]   Fax - [phone]  [facility] [address] [place], [place]  [address] Phone - [phone]   Fax - [place], [place], [place]  [address] Phone - [phone]   Fax - [phone]  [place] [address] [place], [place]  [address] Phone - [phone]   Fax - [phone] [facility] [place] [address] [place], [place]  [address] Phone - [phone]   Fax - [place] [address] [place], [place]  [address] Phone - [phone]   Fax - [place] [address], [place],   [address] Phone - [phone]   Fax - [phone]  [place] [address] [place] [address] Phone [phone]  Fax [phone]

## 2017-12-08 NOTE — Progress Notes (Signed)
   PRENATAL VISIT NOTE  Subjective:  Sierra Daniels is a 22 y.o. G1P0 at [redacted]w[redacted]d being seen today for ongoing prenatal care.  She is currently monitored for the following issues for this high-risk pregnancy and has Supervision of low-risk pregnancy; Depressed mood; and Gestational diabetes on their problem list.  Patient reports cold for the past three days. She has been checking her sugars but has not been following her diet plan. .  Contractions: Not present. Vag. Bleeding: None.  Movement: Present. Denies leaking of fluid.   The following portions of the patient's history were reviewed and updated as appropriate: allergies, current medications, past family history, past medical history, past social history, past surgical history and problem list. Problem list updated.  Objective:   Vitals:   12/08/17 1317  BP: 115/69  Pulse: 100  Weight: 177 lb 4.8 oz (80.4 kg)    Fetal Status: Fetal Heart Rate (bpm): 152 Fundal Height: 32 cm Movement: Present     General:  Alert, oriented and cooperative. Patient is in no acute distress.  Skin: Skin is warm and dry. No rash noted.   Cardiovascular: Normal heart rate noted  Respiratory: Normal respiratory effort, no problems with respiration noted  Abdomen: Soft, gravid, appropriate for gestational age.  Pain/Pressure: Present     Pelvic: Cervical exam deferred        Extremities: Normal range of motion.  Edema: Trace  Mental Status: Normal mood and affect. Normal behavior. Normal judgment and thought content.   Assessment and Plan:  Pregnancy: G1P0 at [redacted]w[redacted]d  1. Encounter for supervision of low-risk pregnancy in third trimester -wants Depo; list of peds.   2. Diet controlled gestational diabetes mellitus (GDM) in third trimester Sugars range from 90 to 115 fasting (3/5 abnormal values); PP range from 91 to 141 with one elevated sugar of 173. OVerall, BS are slightly out of range but given that patient has been sick and has not been following her  diet, sugars are not extremely elevated. Discussed with Dr. Ilda Basset, who recommends patient follow strict diet for a week and return in one week for review blood sugar log. If values still elevated, will start medication.   Preterm labor symptoms and general obstetric precautions including but not limited to vaginal bleeding, contractions, leaking of fluid and fetal movement were reviewed in detail with the patient. Please refer to After Visit Summary for other counseling recommendations.  Return in about 1 week (around 12/15/2017), or 1 week .  No future appointments.  Starr [place], CNM

## 2017-12-15 ENCOUNTER — Ambulatory Visit (INDEPENDENT_AMBULATORY_CARE_PROVIDER_SITE_OTHER): Payer: Medicaid Other | Admitting: Advanced Practice Midwife

## 2017-12-15 DIAGNOSIS — Z3493 Encounter for supervision of normal pregnancy, unspecified, third trimester: Secondary | ICD-10-CM

## 2017-12-15 DIAGNOSIS — O24415 Gestational diabetes mellitus in pregnancy, controlled by oral hypoglycemic drugs: Secondary | ICD-10-CM

## 2017-12-15 MED ORDER — METFORMIN HCL 500 MG PO TABS: 500.0000 mg | ORAL_TABLET | Freq: Every day | ORAL | 3 refills | Status: DC

## 2017-12-15 NOTE — Patient Instructions (Signed)
Research childbirth classes and hospital preregistration at [email or web address]  Fetal Movement Counts Patient Name: ________________________________________________ Patient Due Date: ____________________ What is a fetal movement count? A fetal movement count is the number of times that you feel your baby move during a certain amount of time. This may also be called a fetal kick count. A fetal movement count is recommended for every pregnant woman. You may be asked to start counting fetal movements as early as week 28 of your pregnancy. Pay attention to when your baby is most active. You may notice your baby's sleep and wake cycles. You may also notice things that make your baby move more. You should do a fetal movement count:  When your baby is normally most active.  At the same time each day.  A good time to count movements is while you are resting, after having something to eat and drink. How do I count fetal movements? 1. Find a quiet, comfortable area. Sit, or lie down on your side. 2. Write down the date, the start time and stop time, and the number of movements that you felt between those two times. Take this information with you to your health care visits. 3. For 2 hours, count kicks, flutters, swishes, rolls, and jabs. You should feel at least 10 movements during 2 hours. 4. You may stop counting after you have felt 10 movements. 5. If you do not feel 10 movements in 2 hours, have something to eat and drink. Then, keep resting and counting for 1 hour. If you feel at least 4 movements during that hour, you may stop counting. Contact a health care provider if:  You feel fewer than 4 movements in 2 hours.  Your baby is not moving like he or she usually does. Date: ____________ Start time: ____________ Stop time: ____________ Movements: ____________ Date: ____________ Start time: ____________ Stop time: ____________ Movements: ____________ Date: ____________ Start time: ____________  Stop time: ____________ Movements: ____________ Date: ____________ Start time: ____________ Stop time: ____________ Movements: ____________ Date: ____________ Start time: ____________ Stop time: ____________ Movements: ____________ Date: ____________ Start time: ____________ Stop time: ____________ Movements: ____________ Date: ____________ Start time: ____________ Stop time: ____________ Movements: ____________ Date: ____________ Start time: ____________ Stop time: ____________ Movements: ____________ Date: ____________ Start time: ____________ Stop time: ____________ Movements: ____________ This information is not intended to replace advice given to you by your health care provider. Make sure you discuss any questions you have with your health care provider. Document Released: 04/21/2006 Document Revised: 11/19/2015 Document Reviewed: 05/01/2015 Elsevier Interactive Patient Education  2018 [employer]  Braxton Hicks Contractions Contractions of the uterus can occur throughout pregnancy, but they are not always a sign that you are in labor. You may have practice contractions called Braxton Hicks contractions. These false labor contractions are sometimes confused with true labor. What are Braxton Hicks contractions? Braxton Hicks contractions are tightening movements that occur in the muscles of the uterus before labor. Unlike true labor contractions, these contractions do not result in opening (dilation) and thinning of the cervix. Toward the end of pregnancy (32-34 weeks), Braxton Hicks contractions can happen more often and may become stronger. These contractions are sometimes difficult to tell apart from true labor because they can be very uncomfortable. You should not feel embarrassed if you go to the hospital with false labor. Sometimes, the only way to tell if you are in true labor is for your health care provider to look for changes in the cervix. The health care provider will   do a physical  exam and may monitor your contractions. If you are not in true labor, the exam should show that your cervix is not dilating and your water has not broken. If there are other health problems associated with your pregnancy, it is completely safe for you to be sent home with false labor. You may continue to have Braxton Hicks contractions until you go into true labor. How to tell the difference between true labor and false labor True labor  Contractions last 30-70 seconds.  Contractions become very regular.  Discomfort is usually felt in the top of the uterus, and it spreads to the lower abdomen and low back.  Contractions do not go away with walking.  Contractions usually become more intense and increase in frequency.  The cervix dilates and gets thinner. False labor  Contractions are usually shorter and not as strong as true labor contractions.  Contractions are usually irregular.  Contractions are often felt in the front of the lower abdomen and in the groin.  Contractions may go away when you walk around or change positions while lying down.  Contractions get weaker and are shorter-lasting as time goes on.  The cervix usually does not dilate or become thin. Follow these instructions at home:  Take over-the-counter and prescription medicines only as told by your health care provider.  Keep up with your usual exercises and follow other instructions from your health care provider.  Eat and drink lightly if you think you are going into labor.  If Braxton Hicks contractions are making you uncomfortable: ? Change your position from lying down or resting to walking, or change from walking to resting. ? Sit and rest in a tub of warm water. ? Drink enough fluid to keep your urine pale yellow. Dehydration may cause these contractions. ? Do slow and deep breathing several times an hour.  Keep all follow-up prenatal visits as told by your health care provider. This is  important. Contact a health care provider if:  You have a fever.  You have continuous pain in your abdomen. Get help right away if:  Your contractions become stronger, more regular, and closer together.  You have fluid leaking or gushing from your vagina.  You pass blood-tinged mucus (bloody show).  You have bleeding from your vagina.  You have low back pain that you never had before.  You feel your baby's head pushing down and causing pelvic pressure.  Your baby is not moving inside you as much as it used to. Summary  Contractions that occur before labor are called Braxton Hicks contractions, false labor, or practice contractions.  Braxton Hicks contractions are usually shorter, weaker, farther apart, and less regular than true labor contractions. True labor contractions usually become progressively stronger and regular and they become more frequent.  Manage discomfort from Braxton Hicks contractions by changing position, resting in a warm bath, drinking plenty of water, or practicing deep breathing. This information is not intended to replace advice given to you by your health care provider. Make sure you discuss any questions you have with your health care provider. Document Released: 08/05/2016 Document Revised: 08/05/2016 Document Reviewed: 08/05/2016 Elsevier Interactive Patient Education  2018 [employer]    

## 2017-12-15 NOTE — Progress Notes (Signed)
   PRENATAL VISIT NOTE  Subjective:  Sierra Daniels is a 22 y.o. G1P0 at [redacted]w[redacted]d being seen today for ongoing prenatal care.  She is currently monitored for the following issues for this high-risk pregnancy and has Supervision of low-risk pregnancy; Depressed mood; and Gestational diabetes mellitus (GDM) in third trimester controlled on oral hypoglycemic drug on their problem list.  Patient reports no complaints.  Contractions: Irritability. Vag. Bleeding: None.  Movement: Present. Denies leaking of fluid.   The following portions of the patient's history were reviewed and updated as appropriate: allergies, current medications, past family history, past medical history, past social history, past surgical history and problem list. Problem list updated.  Objective:   Vitals:   12/15/17 1450  BP: 124/68  Pulse: 79  Weight: 176 lb 4.8 oz (80 kg)    Fetal Status: Fetal Heart Rate (bpm): 151 Fundal Height: 33 cm Movement: Present     General:  Alert, oriented and cooperative. Patient is in no acute distress.  Skin: Skin is warm and dry. No rash noted.   Cardiovascular: Normal heart rate noted  Respiratory: Normal respiratory effort, no problems with respiration noted  Abdomen: Soft, gravid, appropriate for gestational age.  Pain/Pressure: Present     Pelvic: Cervical exam deferred        Extremities: Normal range of motion.  Edema: None  Mental Status: Normal mood and affect. Normal behavior. Normal judgment and thought content.   Assessment and Plan:  Pregnancy: G1P0 at [redacted]w[redacted]d  1. Encounter for supervision of low-risk pregnancy in third trimester --No complaints or concerns --Previously considering waterbirth. Confirmed that her GDM precludes her. Discussed hydrotherapy via hot shower and wireless monitors   2. Gestational diabetes mellitus (GDM) in third trimester controlled on oral hypoglycemic drug --At least 1 elevated BG per day since illness --Discussed with Dr. Roselie Awkward. Patient to  start Metformin 500 mg daily with breakfast, continue log --F/u next with with MD to discuss BG  Preterm labor symptoms and general obstetric precautions including but not limited to vaginal bleeding, contractions, leaking of fluid and fetal movement were reviewed in detail with the patient. Please refer to After Visit Summary for other counseling recommendations.  Return in about 1 week (around 12/22/2017) for HROB, needs MD.  No future appointments.  Darlina Rumpf, CNM 12/15/17  3:12 PM

## 2017-12-22 ENCOUNTER — Encounter: Payer: Self-pay | Admitting: Family Medicine

## 2017-12-22 ENCOUNTER — Ambulatory Visit (INDEPENDENT_AMBULATORY_CARE_PROVIDER_SITE_OTHER): Payer: Medicaid Other | Admitting: Family Medicine

## 2017-12-22 VITALS — BP 115/64 | HR 98 | Wt 177.5 lb

## 2017-12-22 DIAGNOSIS — O24415 Gestational diabetes mellitus in pregnancy, controlled by oral hypoglycemic drugs: Secondary | ICD-10-CM

## 2017-12-22 DIAGNOSIS — Z3490 Encounter for supervision of normal pregnancy, unspecified, unspecified trimester: Secondary | ICD-10-CM

## 2017-12-22 NOTE — Progress Notes (Signed)
   PRENATAL VISIT NOTE  Subjective:  Sierra Daniels is a 22 y.o. G1P0 at [redacted]w[redacted]d being seen today for ongoing prenatal care.  She is currently monitored for the following issues for this high-risk pregnancy and has Supervision of low-risk pregnancy; Depressed mood; and Gestational diabetes mellitus (GDM) in third trimester controlled on oral hypoglycemic drug on their problem list.  Patient reports no complaints.  Contractions: Irregular. Vag. Bleeding: None.  Movement: Present. Denies leaking of fluid.   The following portions of the patient's history were reviewed and updated as appropriate: allergies, current medications, past family history, past medical history, past social history, past surgical history and problem list. Problem list updated.  Objective:   Vitals:   12/22/17 1541  BP: 115/64  Pulse: 98  Weight: 177 lb 8 oz (80.5 kg)    Fetal Status: Fetal Heart Rate (bpm): 147 Fundal Height: 31 cm Movement: Present     General:  Alert, oriented and cooperative. Patient is in no acute distress.  Skin: Skin is warm and dry. No rash noted.   Cardiovascular: Normal heart rate noted  Respiratory: Normal respiratory effort, no problems with respiration noted  Abdomen: Soft, gravid, appropriate for gestational age.  Pain/Pressure: Present     Pelvic: Cervical exam deferred        Extremities: Normal range of motion.  Edema: Trace  Mental Status: Normal mood and affect. Normal behavior. Normal judgment and thought content.   Assessment and Plan:  Pregnancy: G1P0 at [redacted]w[redacted]d  1. Gestational diabetes mellitus (GDM) in third trimester controlled on oral hypoglycemic drug FBS 90-113 2 hour pp 85-117  2. Encounter for supervision of low-risk pregnancy, antepartum Continue prenatal care.   Preterm labor symptoms and general obstetric precautions including but not limited to vaginal bleeding, contractions, leaking of fluid and fetal movement were reviewed in detail with the patient. Please  refer to After Visit Summary for other counseling recommendations.  Return in 2 weeks (on 01/05/2018) for weekly BPP, OB visit and BPP.  Future Appointments  Date Time Provider [place]  12/26/2017 11:15 AM [facility] [place] [facility]  01/02/2018  2:15 PM [facility]  01/02/2018  3:15 PM Truett Mainland, DO [facility]    Donnamae Jude, MD

## 2017-12-22 NOTE — Progress Notes (Signed)
Stated just started metformin yesterday.

## 2017-12-22 NOTE — Patient Instructions (Addendum)
AREA PED[facility]ATRIC/FAMILY [place] [address] [place], [place]  [address] Phone - [phone]   Fax - [phone]  [facility] [address] [place], [place] [address] Phone - [phone]   Fax - [place] [address] [place], [place]  [address] Phone - [phone]   Fax - [phone]  [place] [address], [place] [address] [place], [place]  [address] Phone - [phone]   Fax - [phone]  [place] [address] [place], [place]  [address] Phone - [phone]   Fax - [phone]  [facility] [address] [place], [place]  [address] Phone - [phone]   Fax - [place] [address] [place], [place]  [address] Phone - [phone]   Fax - [phone]  [place] [address] [place], [place] [address] [place], [place]  [address] Phone - [phone]   Fax - [place] [address] [place], [place]  [address] Phone - [phone]   Fax - [phone] [facility] [place]. [address] [place], [place]  [address] Phone - [phone]   Fax - [phone]  [place] [address] [place], [place]  [address] Phone - [phone]   Fax - [phone]  [facility] [place], [place], [place]  [address] Phone - [phone]   Fax - [place] [address], [place], [place]  [address] Phone - [phone]   Fax - [phone]  [facility] [address], [place], [place]  [address] Phone - [place], [place]  [address] Phone - [phone]   Fax - [place] [address], [place], [place]  [address] Phone - [phone]   Fax - [phone]  [place] [address] [place], [place]  [address] Phone - [phone]   Fax - [place]. [place], [place]  [address] Phone - [phone]   Fax - [place], [place], [place]  [address] Phone - [phone]   Fax - [place] [address], [place], [place]  [address] Phone - [phone]   Fax - [phone]  [place] [address] [place], [place]  [address] Phone - [phone]   Fax - [place] [address], [place], [place]  [address] Phone - [phone]   Fax - [phone]  [place] [address] [place], [place]  [address] Phone - [phone]   Fax - [phone] [place] [address] [place], [place]  [address] Phone - [phone]   Fax - [place] [address] [place], [place]  [address] Phone - [phone]   Fax - [place] [address] [place],   [address] Phone - [phone]   Fax - [phone]  [place] [address] [place] [address] Phone [phone]  Fax [phone]   Breastfeeding Choosing to breastfeed is one of the best decisions you can make for  yourself and your baby. A change in hormones during pregnancy causes your breasts to make breast milk in your milk-producing glands. Hormones prevent breast milk from being released before your baby is born. They also prompt milk flow after birth. Once breastfeeding has begun, thoughts of your baby, as well as his or her sucking or crying, can stimulate the release of milk from your milk-producing  glands. Benefits of breastfeeding Research shows that breastfeeding offers many health benefits for infants and mothers. It also offers a cost-free and convenient way to feed your baby. For your baby  Your first milk (colostrum) helps your baby's digestive system to function better.  Special cells in your milk (antibodies) help your baby to fight off infections.  Breastfed babies are less likely to develop asthma, allergies, obesity, or type 2 diabetes. They are also at lower risk for sudden infant death syndrome (SIDS).  Nutrients in breast milk are better able to meet your baby's needs compared to infant formula.  Breast milk improves your baby's brain development. For you  Breastfeeding helps to create a very special bond between you and your baby.  Breastfeeding is convenient. Breast milk costs nothing and is always available at the correct temperature.  Breastfeeding helps to burn calories. It helps you to lose the weight that you gained during pregnancy.  Breastfeeding makes your uterus return faster to its size before pregnancy. It also slows bleeding (lochia) after you give birth.  Breastfeeding helps to lower your risk of developing type 2 diabetes, osteoporosis, rheumatoid arthritis, cardiovascular disease, and breast, ovarian, uterine, and endometrial cancer later in life. Breastfeeding basics Starting breastfeeding  Find a comfortable place to sit or lie down, with your neck and back well-supported.  Place a pillow or a rolled-up blanket under your baby to bring him or her to the level of your breast (if you are seated). Nursing pillows are specially designed to help support your arms and your baby while you breastfeed.  Make sure that your baby's tummy (abdomen) is facing your abdomen.  Gently massage your breast. With your fingertips, massage from the outer edges of your breast inward toward the nipple. This encourages milk flow. If your milk flows slowly, you may need  to continue this action during the feeding.  Support your breast with 4 fingers underneath and your thumb above your nipple (make the letter "C" with your hand). Make sure your fingers are well away from your nipple and your baby's mouth.  Stroke your baby's lips gently with your finger or nipple.  When your baby's mouth is open wide enough, quickly bring your baby to your breast, placing your entire nipple and as much of the areola as possible into your baby's mouth. The areola is the colored area around your nipple. ? More areola should be visible above your baby's upper lip than below the lower lip. ? Your baby's lips should be opened and extended outward (flanged) to ensure an adequate, comfortable latch. ? Your baby's tongue should be between his or her lower gum and your breast.  Make sure that your baby's mouth is correctly positioned around your nipple (latched). Your baby's lips should create a seal on your breast and be turned out (everted).  It is common for your baby to suck about 2-3 minutes in order to start the flow of breast milk. Latching Teaching your baby how to latch onto your breast properly is very important. An improper latch can cause nipple pain, decreased milk supply, and poor  weight gain in your baby. Also, if your baby is not latched onto your nipple properly, he or she may swallow some air during feeding. This can make your baby fussy. Burping your baby when you switch breasts during the feeding can help to get rid of the air. However, teaching your baby to latch on properly is still the best way to prevent fussiness from swallowing air while breastfeeding. Signs that your baby has successfully latched onto your nipple  Silent tugging or silent sucking, without causing you pain. Infant's lips should be extended outward (flanged).  Swallowing heard between every 3-4 sucks once your milk has started to flow (after your let-down milk reflex occurs).  Muscle movement  above and in front of his or her ears while sucking.  Signs that your baby has not successfully latched onto your nipple  Sucking sounds or smacking sounds from your baby while breastfeeding.  Nipple pain.  If you think your baby has not latched on correctly, slip your finger into the corner of your baby's mouth to break the suction and place it between your baby's gums. Attempt to start breastfeeding again. Signs of successful breastfeeding Signs from your baby  Your baby will gradually decrease the number of sucks or will completely stop sucking.  Your baby will fall asleep.  Your baby's body will relax.  Your baby will retain a small amount of milk in his or her mouth.  Your baby will let go of your breast by himself or herself.  Signs from you  Breasts that have increased in firmness, weight, and size 1-3 hours after feeding.  Breasts that are softer immediately after breastfeeding.  Increased milk volume, as well as a change in milk consistency and color by the fifth day of breastfeeding.  Nipples that are not sore, cracked, or bleeding.  Signs that your baby is getting enough milk  Wetting at least 1-2 diapers during the first 24 hours after birth.  Wetting at least 5-6 diapers every 24 hours for the first week after birth. The urine should be clear or pale yellow by the age of 5 days.  Wetting 6-8 diapers every 24 hours as your baby continues to grow and develop.  At least 3 stools in a 24-hour period by the age of 5 days. The stool should be soft and yellow.  At least 3 stools in a 24-hour period by the age of 7 days. The stool should be seedy and yellow.  No loss of weight greater than 10% of birth weight during the first 3 days of life.  Average weight gain of 4-7 oz (113-198 g) per week after the age of 4 days.  Consistent daily weight gain by the age of 5 days, without weight loss after the age of 2 weeks. After a feeding, your baby may spit up a small  amount of milk. This is normal. Breastfeeding frequency and duration Frequent feeding will help you make more milk and can prevent sore nipples and extremely full breasts (breast engorgement). Breastfeed when you feel the need to reduce the fullness of your breasts or when your baby shows signs of hunger. This is called "breastfeeding on demand." Signs that your baby is hungry include:  Increased alertness, activity, or restlessness.  Movement of the head from side to side.  Opening of the mouth when the corner of the mouth or cheek is stroked (rooting).  Increased sucking sounds, smacking lips, cooing, sighing, or squeaking.  Hand-to-mouth movements and sucking on fingers  or hands.  Fussing or crying.  Avoid introducing a pacifier to your baby in the first 4-6 weeks after your baby is born. After this time, you may choose to use a pacifier. Research has shown that pacifier use during the first year of a baby's life decreases the risk of sudden infant death syndrome (SIDS). Allow your baby to feed on each breast as long as he or she wants. When your baby unlatches or falls asleep while feeding from the first breast, offer the second breast. Because newborns are often sleepy in the first few weeks of life, you may need to awaken your baby to get him or her to feed. Breastfeeding times will vary from baby to baby. However, the following rules can serve as a guide to help you make sure that your baby is properly fed:  Newborns (babies 16 weeks of age or younger) may breastfeed every 1-3 hours.  Newborns should not go without breastfeeding for longer than 3 hours during the day or 5 hours during the night.  You should breastfeed your baby a minimum of 8 times in a 24-hour period.  Breast milk pumping Pumping and storing breast milk allows you to make sure that your baby is exclusively fed your breast milk, even at times when you are unable to breastfeed. This is especially important if you go  back to work while you are still breastfeeding, or if you are not able to be present during feedings. Your lactation consultant can help you find a method of pumping that works best for you and give you guidelines about how long it is safe to store breast milk. Caring for your breasts while you breastfeed Nipples can become dry, cracked, and sore while breastfeeding. The following recommendations can help keep your breasts moisturized and healthy:  Avoid using soap on your nipples.  Wear a supportive bra designed especially for nursing. Avoid wearing underwire-style bras or extremely tight bras (sports bras).  Air-dry your nipples for 3-4 minutes after each feeding.  Use only cotton bra pads to absorb leaked breast milk. Leaking of breast milk between feedings is normal.  Use lanolin on your nipples after breastfeeding. Lanolin helps to maintain your skin's normal moisture barrier. Pure lanolin is not harmful (not toxic) to your baby. You may also hand express a few drops of breast milk and gently massage that milk into your nipples and allow the milk to air-dry.  In the first few weeks after giving birth, some women experience breast engorgement. Engorgement can make your breasts feel heavy, warm, and tender to the touch. Engorgement peaks within 3-5 days after you give birth. The following recommendations can help to ease engorgement:  Completely empty your breasts while breastfeeding or pumping. You may want to start by applying warm, moist heat (in the shower or with warm, water-soaked hand towels) just before feeding or pumping. This increases circulation and helps the milk flow. If your baby does not completely empty your breasts while breastfeeding, pump any extra milk after he or she is finished.  Apply ice packs to your breasts immediately after breastfeeding or pumping, unless this is too uncomfortable for you. To do this: ? Put ice in a plastic bag. ? Place a towel between your skin  and the bag. ? Leave the ice on for 20 minutes, 2-3 times a day.  Make sure that your baby is latched on and positioned properly while breastfeeding.  If engorgement persists after 48 hours of following these recommendations, contact  your health care provider or a Science writer. Overall health care recommendations while breastfeeding  Eat 3 healthy meals and 3 snacks every day. Well-nourished mothers who are breastfeeding need an additional 450-500 calories a day. You can meet this requirement by increasing the amount of a balanced diet that you eat.  Drink enough water to keep your urine pale yellow or clear.  Rest often, relax, and continue to take your prenatal vitamins to prevent fatigue, stress, and low vitamin and mineral levels in your body (nutrient deficiencies).  Do not use any products that contain nicotine or tobacco, such as cigarettes and e-cigarettes. Your baby may be harmed by chemicals from cigarettes that pass into breast milk and exposure to secondhand smoke. If you need help quitting, ask your health care provider.  Avoid alcohol.  Do not use illegal drugs or marijuana.  Talk with your health care provider before taking any medicines. These include over-the-counter and prescription medicines as well as vitamins and herbal supplements. Some medicines that may be harmful to your baby can pass through breast milk.  It is possible to become pregnant while breastfeeding. If birth control is desired, ask your health care provider about options that will be safe while breastfeeding your baby. Where to find more information: [employer]: www.llli.org Contact a health care provider if:  You feel like you want to stop breastfeeding or have become frustrated with breastfeeding.  Your nipples are cracked or bleeding.  Your breasts are red, tender, or warm.  You have: ? Painful breasts or nipples. ? A swollen area on either breast. ? A fever or  chills. ? Nausea or vomiting. ? Drainage other than breast milk from your nipples.  Your breasts do not become full before feedings by the fifth day after you give birth.  You feel sad and depressed.  Your baby is: ? Too sleepy to eat well. ? Having trouble sleeping. ? More than 46 week old and wetting fewer than 6 diapers in a 24-hour period. ? Not gaining weight by 40 days of age.  Your baby has fewer than 3 stools in a 24-hour period.  Your baby's skin or the white parts of his or her eyes become yellow. Get help right away if:  Your baby is overly tired (lethargic) and does not want to wake up and feed.  Your baby develops an unexplained fever. Summary  Breastfeeding offers many health benefits for infant and mothers.  Try to breastfeed your infant when he or she shows early signs of hunger.  Gently tickle or stroke your baby's lips with your finger or nipple to allow the baby to open his or her mouth. Bring the baby to your breast. Make sure that much of the areola is in your baby's mouth. Offer one side and burp the baby before you offer the other side.  Talk with your health care provider or lactation consultant if you have questions or you face problems as you breastfeed. This information is not intended to replace advice given to you by your health care provider. Make sure you discuss any questions you have with your health care provider. Document Released: 03/22/2005 Document Revised: 04/23/2016 Document Reviewed: 04/23/2016 Elsevier Interactive Patient Education  [employer].

## 2017-12-26 ENCOUNTER — Ambulatory Visit: Payer: Self-pay

## 2017-12-26 ENCOUNTER — Ambulatory Visit (INDEPENDENT_AMBULATORY_CARE_PROVIDER_SITE_OTHER): Payer: Medicaid Other | Admitting: *Deleted

## 2017-12-26 VITALS — BP 119/68 | HR 112 | Wt 179.7 lb

## 2017-12-26 DIAGNOSIS — O24415 Gestational diabetes mellitus in pregnancy, controlled by oral hypoglycemic drugs: Secondary | ICD-10-CM

## 2017-12-26 NOTE — Progress Notes (Signed)

## 2017-12-27 NOTE — Progress Notes (Signed)
  NST:  Baseline: 140 bpm, Variability: Good {> 6 bpm), Accelerations: Reactive and Decelerations: Absent   

## 2018-01-02 ENCOUNTER — Ambulatory Visit (INDEPENDENT_AMBULATORY_CARE_PROVIDER_SITE_OTHER): Payer: Medicaid Other | Admitting: *Deleted

## 2018-01-02 ENCOUNTER — Ambulatory Visit: Payer: Self-pay

## 2018-01-02 ENCOUNTER — Ambulatory Visit (INDEPENDENT_AMBULATORY_CARE_PROVIDER_SITE_OTHER): Payer: Medicaid Other | Admitting: Family Medicine

## 2018-01-02 VITALS — BP 122/62 | HR 100 | Wt 183.7 lb

## 2018-01-02 DIAGNOSIS — O24415 Gestational diabetes mellitus in pregnancy, controlled by oral hypoglycemic drugs: Secondary | ICD-10-CM

## 2018-01-02 DIAGNOSIS — O0990 Supervision of high risk pregnancy, unspecified, unspecified trimester: Secondary | ICD-10-CM

## 2018-01-02 NOTE — Progress Notes (Signed)

## 2018-01-02 NOTE — Progress Notes (Signed)
Subjective:  Sierra Daniels is a 22 y.o. G1P0 at [redacted]w[redacted]d being seen today for ongoing prenatal care.  She is currently monitored for the following issues for this high-risk pregnancy and has Supervision of low-risk pregnancy; Depressed mood; and Gestational diabetes mellitus (GDM) in third trimester controlled on oral hypoglycemic drug on their problem list.  GDM: Patient taking metformin .  Reports no hypoglycemic episodes.  Tolerating medication well Fasting:80-90 2hr PP: 129-130 at highest, but only 3 times.  Patient reports no complaints.  Contractions: Irregular. Vag. Bleeding: None.  Movement: Present. Denies leaking of fluid.   The following portions of the patient's history were reviewed and updated as appropriate: allergies, current medications, past family history, past medical history, past social history, past surgical history and problem list. Problem list updated.  Objective:   Vitals:   01/02/18 1444  BP: 122/62  Pulse: 100  Weight: 183 lb 11.2 oz (83.3 kg)    Fetal Status: Fetal Heart Rate (bpm): NST   Movement: Present     General:  Alert, oriented and cooperative. Patient is in no acute distress.  Skin: Skin is warm and dry. No rash noted.   Cardiovascular: Normal heart rate noted  Respiratory: Normal respiratory effort, no problems with respiration noted  Abdomen: Soft, gravid, appropriate for gestational age. Pain/Pressure: Present     Pelvic: Vag. Bleeding: None Vag D/C Character: Mucous   Cervical exam deferred        Extremities: Normal range of motion.     Mental Status: Normal mood and affect. Normal behavior. Normal judgment and thought content.   Urinalysis:      Assessment and Plan:  Pregnancy: G1P0 at [redacted]w[redacted]d  1. Supervision of high risk pregnancy, antepartum FHt normal  2. Gestational diabetes mellitus (GDM) in third trimester controlled on oral hypoglycemic drug Controlled with metformin. BPP 8/8 NST reactive Induce at 39 weeks  Preterm labor  symptoms and general obstetric precautions including but not limited to vaginal bleeding, contractions, leaking of fluid and fetal movement were reviewed in detail with the patient. Please refer to After Visit Summary for other counseling recommendations.  Return in about 1 week (around 01/09/2018) for NSt/BPP and HOB weekly, Needs UDIP each visit.   Truett Mainland, DO

## 2018-01-05 ENCOUNTER — Encounter: Payer: Self-pay | Admitting: Obstetrics & Gynecology

## 2018-01-10 ENCOUNTER — Ambulatory Visit (INDEPENDENT_AMBULATORY_CARE_PROVIDER_SITE_OTHER): Payer: Medicaid Other | Admitting: *Deleted

## 2018-01-10 ENCOUNTER — Encounter: Payer: Self-pay | Admitting: Advanced Practice Midwife

## 2018-01-10 ENCOUNTER — Other Ambulatory Visit (HOSPITAL_COMMUNITY)
Admission: RE | Admit: 2018-01-10 | Discharge: 2018-01-10 | Disposition: A | Discharge status: Home / Self Care | Payer: Medicaid Other | Source: Ambulatory Visit | Attending: Advanced Practice Midwife | Admitting: Advanced Practice Midwife

## 2018-01-10 ENCOUNTER — Ambulatory Visit: Payer: Self-pay

## 2018-01-10 ENCOUNTER — Ambulatory Visit (INDEPENDENT_AMBULATORY_CARE_PROVIDER_SITE_OTHER): Payer: Medicaid Other | Admitting: Advanced Practice Midwife

## 2018-01-10 VITALS — BP 116/71 | HR 87 | Wt 187.3 lb

## 2018-01-10 DIAGNOSIS — O24415 Gestational diabetes mellitus in pregnancy, controlled by oral hypoglycemic drugs: Secondary | ICD-10-CM

## 2018-01-10 DIAGNOSIS — Z3A37 37 weeks gestation of pregnancy: Secondary | ICD-10-CM

## 2018-01-10 DIAGNOSIS — Z362 Encounter for other antenatal screening follow-up: Secondary | ICD-10-CM

## 2018-01-10 DIAGNOSIS — M419 Scoliosis, unspecified: Secondary | ICD-10-CM | POA: Insufficient documentation

## 2018-01-10 DIAGNOSIS — Z3493 Encounter for supervision of normal pregnancy, unspecified, third trimester: Secondary | ICD-10-CM

## 2018-01-10 LAB — POCT URINALYSIS DIP (DEVICE)
Bilirubin Urine: NEGATIVE
GLUCOSE, UA: NEGATIVE mg/dL
Hgb urine dipstick: NEGATIVE
KETONES UR: NEGATIVE mg/dL
Nitrite: NEGATIVE
Protein, ur: NEGATIVE mg/dL
Specific Gravity, Urine: 1.025 (ref 1.005–1.030)
Urobilinogen, UA: 0.2 mg/dL (ref 0.0–1.0)
pH: 6.5 (ref 5.0–8.0)

## 2018-01-10 LAB — GLUCOSE, CAPILLARY: GLUCOSE-CAPILLARY: 61 mg/dL — AB (ref 70–99)

## 2018-01-10 NOTE — Patient Instructions (Signed)
Braxton Hicks Contractions °Contractions of the uterus can occur throughout pregnancy, but they are not always a sign that you are in labor. You may have practice contractions called Braxton Hicks contractions. These false labor contractions are sometimes confused with true labor. °What are Braxton Hicks contractions? °Braxton Hicks contractions are tightening movements that occur in the muscles of the uterus before labor. Unlike true labor contractions, these contractions do not result in opening (dilation) and thinning of the cervix. Toward the end of pregnancy (32-34 weeks), Braxton Hicks contractions can happen more often and may become stronger. These contractions are sometimes difficult to tell apart from true labor because they can be very uncomfortable. You should not feel embarrassed if you go to the hospital with false labor. °Sometimes, the only way to tell if you are in true labor is for your health care provider to look for changes in the cervix. The health care provider will do a physical exam and may monitor your contractions. If you are not in true labor, the exam should show that your cervix is not dilating and your water has not broken. °If there are other health problems associated with your pregnancy, it is completely safe for you to be sent home with false labor. You may continue to have Braxton Hicks contractions until you go into true labor. °How to tell the difference between true labor and false labor °True labor °· Contractions last 30-70 seconds. °· Contractions become very regular. °· Discomfort is usually felt in the top of the uterus, and it spreads to the lower abdomen and low back. °· Contractions do not go away with walking. °· Contractions usually become more intense and increase in frequency. °· The cervix dilates and gets thinner. °False labor °· Contractions are usually shorter and not as strong as true labor contractions. °· Contractions are usually irregular. °· Contractions  are often felt in the front of the lower abdomen and in the groin. °· Contractions may go away when you walk around or change positions while lying down. °· Contractions get weaker and are shorter-lasting as time goes on. °· The cervix usually does not dilate or become thin. °Follow these instructions at home: °· Take over-the-counter and prescription medicines only as told by your health care provider. °· Keep up with your usual exercises and follow other instructions from your health care provider. °· Eat and drink lightly if you think you are going into labor. °· If Braxton Hicks contractions are making you uncomfortable: °? Change your position from lying down or resting to walking, or change from walking to resting. °? Sit and rest in a tub of warm water. °? Drink enough fluid to keep your urine pale yellow. Dehydration may cause these contractions. °? Do slow and deep breathing several times an hour. °· Keep all follow-up prenatal visits as told by your health care provider. This is important. °Contact a health care provider if: °· You have a fever. °· You have continuous pain in your abdomen. °Get help right away if: °· Your contractions become stronger, more regular, and closer together. °· You have fluid leaking or gushing from your vagina. °· You pass blood-tinged mucus (bloody show). °· You have bleeding from your vagina. °· You have low back pain that you never had before. °· You feel your baby’s head pushing down and causing pelvic pressure. °· Your baby is not moving inside you as much as it used to. °Summary °· Contractions that occur before labor are called Braxton   Hicks contractions, false labor, or practice contractions. °· Braxton Hicks contractions are usually shorter, weaker, farther apart, and less regular than true labor contractions. True labor contractions usually become progressively stronger and regular and they become more frequent. °· Manage discomfort from Braxton Hicks contractions by  changing position, resting in a warm bath, drinking plenty of water, or practicing deep breathing. °This information is not intended to replace advice given to you by your health care provider. Make sure you discuss any questions you have with your health care provider. °Document Released: 08/05/2016 Document Revised: 08/05/2016 Document Reviewed: 08/05/2016 °Elsevier Interactive Patient Education © 2018 Elsevier Inc. ° °

## 2018-01-10 NOTE — Progress Notes (Signed)
   PRENATAL VISIT NOTE  Subjective:  Sierra Daniels is a 22 y.o. G1P0 at [redacted]w[redacted]d being seen today for ongoing prenatal care.  She is currently monitored for the following issues for this high-risk pregnancy and has Supervision of low-risk pregnancy; Depressed mood; Gestational diabetes mellitus (GDM) in third trimester controlled on oral hypoglycemic drug; and Scoliosis of thoracic spine on their problem list.  Patient reports Hip pain.  Contractions: Irregular. Vag. Bleeding: None.  Movement: Present. Denies leaking of fluid.   Didn't check blood sugar x 1 week. No explanation. Has supplies. Starts she has been taking meds.   The following portions of the patient's history were reviewed and updated as appropriate: allergies, current medications, past family history, past medical history, past social history, past surgical history and problem list. Problem list updated.  Objective:   Vitals:   01/10/18 1332  BP: 116/71  Pulse: 87  Weight: 187 lb 4.8 oz (85 kg)    Fetal Status: Fetal Heart Rate (bpm): NST   Movement: Present     BPP 8/10 2 off for no FBM. I reviewed the NST and agree with the nursing assessment.  EFM: Baseline: 135 bpm, Variability: Good {> 6 bpm), Accelerations: Reactive and Decelerations: Absent [place]: [place], [place], [place] 01/10/2018 11:44 PM    General:  Alert, oriented and cooperative. Patient is in no acute distress.  Skin: Skin is warm and dry. No rash noted.   Cardiovascular: Normal heart rate noted  Respiratory: Normal respiratory effort, no problems with respiration noted  Abdomen: Soft, gravid, appropriate for gestational age.  Pain/Pressure: Present     Pelvic: Cervical exam performed Dilation: Closed Effacement (%): 50 Station: -3  Extremities: Normal range of motion.     Mental Status: Normal mood and affect. Normal behavior. Normal judgment and thought content.    Random CBG 61  Assessment and Plan:  Pregnancy: G1P0 at [redacted]w[redacted]d  1. Gestational  diabetes mellitus (GDM) in third trimester controlled on oral hypoglycemic drug - Encouraged to check CBGs and bring log. Lengthy discussion about importance of testing blood sugars, bringing log book and keeping appointments. Discussed risks of uncontrolled DM in pregnancy including delayed fetal lung maturity, IUFD and shoulder dystocia possibly resulting brachial plexus palsy, brain damage, intrapartum death and extensive obstetric lacerations. Patient verbalizes understanding.   - [place] MFM OB FOLLOW UP; Future  2. Encounter for supervision of low-risk pregnancy in third trimester   3. Encounter for other antenatal screening follow-up  - [place] MFM OB FOLLOW UP; Future  4. [redacted] weeks gestation of pregnancy  - [place] MFM OB FOLLOW UP; Future  Term labor symptoms and general obstetric precautions including but not limited to vaginal bleeding, contractions, leaking of fluid and fetal movement were reviewed in detail with the patient. Please refer to After Visit Summary for other counseling recommendations.  Return in about 1 week (around 01/17/2018) for HOB/NST/BPP; 10/22 NST only @ 0915, HOB w/[place] @ 1015 - has [place] @ 1100.  Future Appointments  Date Time Provider [place]  01/18/2018  1:15 PM WOC-WOCA NST WOC-WOCA WOC  01/24/2018 11:00 AM WH-MFC [place] 3 WH-MFCUS MFC-US    Kenijah [place], [place]

## 2018-01-10 NOTE — Progress Notes (Signed)
Pt reports having pain in both hips.

## 2018-01-10 NOTE — Progress Notes (Signed)

## 2018-01-11 LAB — GC/CHLAMYDIA PROBE AMP (~~LOC~~) NOT AT ARMC
Chlamydia: NEGATIVE
Neisseria Gonorrhea: NEGATIVE

## 2018-01-12 ENCOUNTER — Encounter: Payer: Self-pay | Admitting: Obstetrics & Gynecology

## 2018-01-12 LAB — STREP GP B NAA: Strep Gp B NAA: NEGATIVE

## 2018-01-18 ENCOUNTER — Ambulatory Visit (INDEPENDENT_AMBULATORY_CARE_PROVIDER_SITE_OTHER): Payer: Medicaid Other | Admitting: *Deleted

## 2018-01-18 ENCOUNTER — Ambulatory Visit (INDEPENDENT_AMBULATORY_CARE_PROVIDER_SITE_OTHER): Payer: Medicaid Other | Admitting: Obstetrics and Gynecology

## 2018-01-18 ENCOUNTER — Ambulatory Visit: Payer: Self-pay

## 2018-01-18 ENCOUNTER — Encounter: Payer: Self-pay | Admitting: Obstetrics and Gynecology

## 2018-01-18 ENCOUNTER — Other Ambulatory Visit (HOSPITAL_COMMUNITY)
Admission: RE | Admit: 2018-01-18 | Discharge: 2018-01-18 | Disposition: A | Discharge status: Home / Self Care | Payer: Medicaid Other | Source: Ambulatory Visit | Attending: Obstetrics and Gynecology | Admitting: Obstetrics and Gynecology

## 2018-01-18 VITALS — BP 125/71 | HR 85 | Wt 188.3 lb

## 2018-01-18 DIAGNOSIS — O24415 Gestational diabetes mellitus in pregnancy, controlled by oral hypoglycemic drugs: Secondary | ICD-10-CM

## 2018-01-18 DIAGNOSIS — N898 Other specified noninflammatory disorders of vagina: Secondary | ICD-10-CM

## 2018-01-18 DIAGNOSIS — Z3493 Encounter for supervision of normal pregnancy, unspecified, third trimester: Secondary | ICD-10-CM

## 2018-01-18 DIAGNOSIS — B373 Candidiasis of vulva and vagina: Secondary | ICD-10-CM | POA: Diagnosis not present

## 2018-01-18 NOTE — Progress Notes (Signed)
   PRENATAL VISIT NOTE  Subjective:  Sierra Daniels is a 22 y.o. G1P0 at [redacted]w[redacted]d being seen today for ongoing prenatal care.  She is currently monitored for the following issues for this high-risk pregnancy and has Supervision of low-risk pregnancy; Depressed mood; Gestational diabetes mellitus (GDM) in third trimester controlled on oral hypoglycemic drug; and Scoliosis of thoracic spine on their problem list.  Patient reports leaking fluid and having wet spot in underwear for three days..  Contractions: Irregular. Vag. Bleeding: None.  Movement: Present. Denies leaking of fluid.   The following portions of the patient's history were reviewed and updated as appropriate: allergies, current medications, past family history, past medical history, past social history, past surgical history and problem list. Problem list updated.  Objective:   Vitals:   01/18/18 1344  BP: 125/71  Pulse: 85  Weight: 188 lb 4.8 oz (85.4 kg)    Fetal Status: Fetal Heart Rate (bpm): NST   Movement: Present     General:  Alert, oriented and cooperative. Patient is in no acute distress.  Skin: Skin is warm and dry. No rash noted.   Cardiovascular: Normal heart rate noted  Respiratory: Normal respiratory effort, no problems with respiration noted  Abdomen: Soft, gravid, appropriate for gestational age.  Pain/Pressure: Present     Pelvic: Cervical exam deferred      SSE: copious white discharge, no pooling  Extremities: Normal range of motion.     Mental Status: Normal mood and affect. Normal behavior. Normal judgment and thought content.   Assessment and Plan:  Pregnancy: G1P0 at [redacted]w[redacted]d  1. Encounter for supervision of low-risk pregnancy in third trimester For depo  2. Gestational diabetes mellitus (GDM) in third trimester controlled on oral hypoglycemic drug Metformin 500 mg daily with breakfast FG: 83-129 PP: 90-154, mostly < 120 Will start metformin 500 mg BID NST reactive BPP today IOL scheduled for 39  weeks  3. Leaking fluid Wet prep Negative fern test  Term labor symptoms and general obstetric precautions including but not limited to vaginal bleeding, contractions, leaking of fluid and fetal movement were reviewed in detail with the patient. Please refer to After Visit Summary for other counseling recommendations.  Return in about 1 year (around 01/25/2019) for as scheduled.  Future Appointments  Date Time Provider [place]  01/24/2018  9:15 AM WOC-WOCA NST WOC-WOCA WOC  01/24/2018 10:15 AM Aletha Halim, MD WOC-WOCA WOC  01/24/2018 11:00 AM WH-MFC [place] 3 WH-MFCUS MFC-US  01/27/2018 12:00 AM WH-BSSCHED ROOM WH-BSSCHED None    Sloan Leiter, MD

## 2018-01-18 NOTE — Progress Notes (Signed)
Pt states she has had fluid leaking from vagina x3 days.

## 2018-01-18 NOTE — Progress Notes (Signed)

## 2018-01-19 ENCOUNTER — Encounter (HOSPITAL_COMMUNITY): Payer: Self-pay | Admitting: *Deleted

## 2018-01-19 ENCOUNTER — Encounter: Payer: Self-pay | Admitting: Family Medicine

## 2018-01-19 ENCOUNTER — Telehealth (HOSPITAL_COMMUNITY): Payer: Self-pay | Admitting: *Deleted

## 2018-01-19 LAB — CERVICOVAGINAL ANCILLARY ONLY
Bacterial vaginitis: NEGATIVE
CANDIDA VAGINITIS: POSITIVE — AB
Chlamydia: NEGATIVE
Neisseria Gonorrhea: NEGATIVE
Trichomonas: NEGATIVE

## 2018-01-19 MED ORDER — CLOTRIMAZOLE 1 % VA CREA: 1.0000 | TOPICAL_CREAM | Freq: Every day | VAGINAL | 2 refills | Status: DC

## 2018-01-19 NOTE — Telephone Encounter (Signed)
Preadmission screen  

## 2018-01-19 NOTE — Addendum Note (Signed)
Addended by: Vivien Rota on: 01/19/2018 05:36 PM   Modules accepted: Orders

## 2018-01-24 ENCOUNTER — Encounter (HOSPITAL_COMMUNITY): Payer: Self-pay

## 2018-01-24 ENCOUNTER — Ambulatory Visit (INDEPENDENT_AMBULATORY_CARE_PROVIDER_SITE_OTHER): Payer: Medicaid Other | Admitting: *Deleted

## 2018-01-24 ENCOUNTER — Encounter: Payer: Self-pay | Admitting: *Deleted

## 2018-01-24 ENCOUNTER — Ambulatory Visit (INDEPENDENT_AMBULATORY_CARE_PROVIDER_SITE_OTHER): Payer: Medicaid Other | Admitting: Obstetrics & Gynecology

## 2018-01-24 ENCOUNTER — Ambulatory Visit (HOSPITAL_COMMUNITY)
Admission: RE | Admit: 2018-01-24 | Discharge: 2018-01-24 | Disposition: A | Discharge status: Home / Self Care | Payer: Medicaid Other | Source: Ambulatory Visit | Attending: Advanced Practice Midwife | Admitting: Advanced Practice Midwife

## 2018-01-24 VITALS — BP 116/73 | HR 91 | Wt 188.1 lb

## 2018-01-24 DIAGNOSIS — O24415 Gestational diabetes mellitus in pregnancy, controlled by oral hypoglycemic drugs: Secondary | ICD-10-CM | POA: Diagnosis present

## 2018-01-24 DIAGNOSIS — Z362 Encounter for other antenatal screening follow-up: Secondary | ICD-10-CM | POA: Diagnosis not present

## 2018-01-24 DIAGNOSIS — F329 Major depressive disorder, single episode, unspecified: Secondary | ICD-10-CM

## 2018-01-24 DIAGNOSIS — Z3A38 38 weeks gestation of pregnancy: Secondary | ICD-10-CM

## 2018-01-24 DIAGNOSIS — Z3493 Encounter for supervision of normal pregnancy, unspecified, third trimester: Secondary | ICD-10-CM

## 2018-01-24 DIAGNOSIS — R4589 Other symptoms and signs involving emotional state: Secondary | ICD-10-CM

## 2018-01-24 DIAGNOSIS — Z3A37 37 weeks gestation of pregnancy: Secondary | ICD-10-CM | POA: Insufficient documentation

## 2018-01-24 DIAGNOSIS — M4124 Other idiopathic scoliosis, thoracic region: Secondary | ICD-10-CM

## 2018-01-24 NOTE — Patient Instructions (Signed)
Labor Induction Labor induction is when steps are taken to cause a pregnant woman to begin the labor process. Most women go into labor on their own between 37 weeks and 42 weeks of the pregnancy. When this does not happen or when there is a medical need, methods may be used to induce labor. Labor induction causes a pregnant woman's uterus to contract. It also causes the cervix to soften (ripen), open (dilate), and thin out (efface). Usually, labor is not induced before 39 weeks of the pregnancy unless there is a problem with the baby or mother. Before inducing labor, your health care provider will consider a number of factors, including the following:  The medical condition of you and the baby.  How many weeks along you are.  The status of the baby's lung maturity.  The condition of the cervix.  The position of the baby. What are the reasons for labor induction? Labor may be induced for the following reasons:  The health of the baby or mother is at risk.  The pregnancy is overdue by 1 week or more.  The water breaks but labor does not start on its own.  The mother has a health condition or serious illness, such as high blood pressure, infection, placental abruption, or diabetes.  The amniotic fluid amounts are low around the baby.  The baby is distressed. Convenience or wanting the baby to be born on a certain date is not a reason for inducing labor. What methods are used for labor induction? Several methods of labor induction may be used, such as:  Prostaglandin medicine. This medicine causes the cervix to dilate and ripen. The medicine will also start contractions. It can be taken by mouth or by inserting a suppository into the vagina.  Inserting a thin tube (catheter) with a balloon on the end into the vagina to dilate the cervix. Once inserted, the balloon is expanded with water, which causes the cervix to open.  Stripping the membranes. Your health care provider separates  amniotic sac tissue from the cervix, causing the cervix to be stretched and causing the release of a hormone called progesterone. This may cause the uterus to contract. It is often done during an office visit. You will be sent home to wait for the contractions to begin. You will then come in for an induction.  Breaking the water. Your health care provider makes a hole in the amniotic sac using a small instrument. Once the amniotic sac breaks, contractions should begin. This may still take hours to see an effect.  Medicine to trigger or strengthen contractions. This medicine is given through an IV access tube inserted into a vein in your arm. All of the methods of induction, besides stripping the membranes, will be done in the hospital. Induction is done in the hospital so that you and the baby can be carefully monitored. How long does it take for labor to be induced? Some inductions can take up to 2-3 days. Depending on the cervix, it usually takes less time. It takes longer when you are induced early in the pregnancy or if this is your first pregnancy. If a mother is still pregnant and the induction has been going on for 2-3 days, either the mother will be sent home or a cesarean delivery will be needed. What are the risks associated with labor induction? Some of the risks of induction include:  Changes in fetal heart rate, such as too high, too low, or erratic.  Fetal distress.    Chance of infection for the mother and baby.  Increased chance of having a cesarean delivery.  Breaking off (abruption) of the placenta from the uterus (rare).  Uterine rupture (very rare). When induction is needed for medical reasons, the benefits of induction may outweigh the risks. What are some reasons for not inducing labor? Labor induction should not be done if:  It is shown that your baby does not tolerate labor.  You have had previous surgeries on your uterus, such as a myomectomy or the removal of  fibroids.  Your placenta lies very low in the uterus and blocks the opening of the cervix (placenta previa).  Your baby is not in a head-down position.  The umbilical cord drops down into the birth canal in front of the baby. This could cut off the baby's blood and oxygen supply.  You have had a previous cesarean delivery.  There are unusual circumstances, such as the baby being extremely premature. This information is not intended to replace advice given to you by your health care provider. Make sure you discuss any questions you have with your health care provider. Document Released: 08/11/2006 Document Revised: 08/28/2015 Document Reviewed: 10/19/2012 Elsevier Interactive Patient Education  2017 Elsevier Inc.  

## 2018-01-24 NOTE — Progress Notes (Signed)
Pt reports increased frequency of UC's today.  [place] for growth and BPP @ 1100 today.  IOL scheduled 10/25 @ midnight.

## 2018-01-24 NOTE — Progress Notes (Signed)
   PRENATAL VISIT NOTE  Subjective:  Sierra Daniels is a 22 y.o. G1P0 at [redacted]w[redacted]d being seen today for ongoing prenatal care.  She is currently monitored for the following issues for this high-risk pregnancy and has Supervision of low-risk pregnancy; Depressed mood; Gestational diabetes mellitus (GDM) in third trimester controlled on oral hypoglycemic drug; and Scoliosis of thoracic spine on their problem list.  Patient reports occasional contractions.  Contractions: Irregular. Vag. Bleeding: None.  Movement: Present. Denies leaking of fluid.   The following portions of the patient's history were reviewed and updated as appropriate: allergies, current medications, past family history, past medical history, past social history, past surgical history and problem list. Problem list updated.  Objective:   Vitals:   01/24/18 0946  BP: 116/73  Pulse: 91  Weight: 188 lb 1.6 oz (85.3 kg)    Fetal Status: Fetal Heart Rate (bpm): NST   Movement: Present     General:  Alert, oriented and cooperative. Patient is in no acute distress.  Skin: Skin is warm and dry. No rash noted.   Cardiovascular: Normal heart rate noted  Respiratory: Normal respiratory effort, no problems with respiration noted  Abdomen: Soft, gravid, appropriate for gestational age.  Pain/Pressure: Present     Pelvic: Cervical exam deferred        Extremities: Normal range of motion.     Mental Status: Normal mood and affect. Normal behavior. Normal judgment and thought content.   Assessment and Plan:  Pregnancy: G1P0 at [redacted]w[redacted]d  1. Encounter for supervision of low-risk pregnancy in third trimester NST reviewed and reactive. For BPP later today  2. Gestational diabetes mellitus (GDM) in third trimester controlled on oral hypoglycemic drug Scheduled for IOL 01/27/2018 Orders in EPIC  3. Other idiopathic scoliosis, thoracic region  4. Depressed mood  Term labor symptoms and general obstetric precautions including but not limited  to vaginal bleeding, contractions, leaking of fluid and fetal movement were reviewed in detail with the patient. Please refer to After Visit Summary for other counseling recommendations.  Return in about 6 weeks (around 03/07/2018) for PP visit w/2hr GTT.  IOL on 10/25.  Future Appointments  Date Time Provider [place]  01/24/2018 10:15 AM Lavonia Drafts, MD WOC-WOCA [place]  01/24/2018 11:00 AM WH-MFC [place] 3 WH-MFCUS MFC-US  01/27/2018 12:00 AM WH-BSSCHED ROOM WH-BSSCHED None    Lavonia Drafts, MD

## 2018-01-25 ENCOUNTER — Encounter: Payer: Self-pay | Admitting: Obstetrics and Gynecology

## 2018-01-27 ENCOUNTER — Inpatient Hospital Stay (HOSPITAL_COMMUNITY): Payer: Medicaid Other | Admitting: Anesthesiology

## 2018-01-27 ENCOUNTER — Encounter (HOSPITAL_COMMUNITY): Payer: Self-pay

## 2018-01-27 ENCOUNTER — Inpatient Hospital Stay (HOSPITAL_COMMUNITY)
Admission: RE | Admit: 2018-01-27 | Discharge: 2018-01-29 | DRG: 807 | Disposition: A | Discharge status: Home / Self Care | Payer: Medicaid Other | Attending: Obstetrics and Gynecology | Admitting: Obstetrics and Gynecology

## 2018-01-27 DIAGNOSIS — Z87891 Personal history of nicotine dependence: Secondary | ICD-10-CM | POA: Diagnosis not present

## 2018-01-27 DIAGNOSIS — Z3A39 39 weeks gestation of pregnancy: Secondary | ICD-10-CM | POA: Diagnosis not present

## 2018-01-27 DIAGNOSIS — O24425 Gestational diabetes mellitus in childbirth, controlled by oral hypoglycemic drugs: Principal | ICD-10-CM | POA: Diagnosis present

## 2018-01-27 DIAGNOSIS — M419 Scoliosis, unspecified: Secondary | ICD-10-CM | POA: Diagnosis present

## 2018-01-27 DIAGNOSIS — O24419 Gestational diabetes mellitus in pregnancy, unspecified control: Secondary | ICD-10-CM | POA: Diagnosis present

## 2018-01-27 LAB — GLUCOSE, CAPILLARY
GLUCOSE-CAPILLARY: 161 mg/dL — AB (ref 70–99)
GLUCOSE-CAPILLARY: 166 mg/dL — AB (ref 70–99)
GLUCOSE-CAPILLARY: 73 mg/dL (ref 70–99)
Glucose-Capillary: 160 mg/dL — ABNORMAL HIGH (ref 70–99)
Glucose-Capillary: 129 mg/dL — ABNORMAL HIGH (ref 70–99)
Glucose-Capillary: 85 mg/dL (ref 70–99)
Glucose-Capillary: 74 mg/dL (ref 70–99)

## 2018-01-27 LAB — CBC
HCT: 35.2 % — ABNORMAL LOW (ref 36.0–46.0)
HEMOGLOBIN: 11.8 g/dL — AB (ref 12.0–15.0)
MCH: 27.1 pg (ref 26.0–34.0)
MCHC: 33.5 g/dL (ref 30.0–36.0)
MCV: 80.9 fL (ref 80.0–100.0)
Platelets: 253 10*3/uL (ref 150–400)
RBC: 4.35 MIL/uL (ref 3.87–5.11)
RDW: 13.2 % (ref 11.5–15.5)
WBC: 7.7 10*3/uL (ref 4.0–10.5)
nRBC: 0 % (ref 0.0–0.2)

## 2018-01-27 LAB — TYPE AND SCREEN
ABO/RH(D): B POS
Antibody Screen: NEGATIVE

## 2018-01-27 LAB — ABO/RH: ABO/RH(D): B POS

## 2018-01-27 LAB — RPR: RPR: NONREACTIVE

## 2018-01-27 MED ORDER — LACTATED RINGERS IV SOLN
INTRAVENOUS | Status: DC
  Administered 2018-01-27 (×3): via INTRAVENOUS

## 2018-01-27 MED ORDER — OXYTOCIN BOLUS FROM INFUSION
500.0000 mL | Freq: Once | INTRAVENOUS | Status: AC
  Administered 2018-01-27: 500 mL via INTRAVENOUS

## 2018-01-27 MED ORDER — PHENYLEPHRINE 40 MCG/ML (10ML) SYRINGE FOR IV PUSH (FOR BLOOD PRESSURE SUPPORT)
80.0000 ug | PREFILLED_SYRINGE | INTRAVENOUS | Status: DC | PRN
  Filled 2018-01-27: qty 5

## 2018-01-27 MED ORDER — EPHEDRINE 5 MG/ML INJ
10.0000 mg | INTRAVENOUS | Status: DC | PRN
  Filled 2018-01-27: qty 2

## 2018-01-27 MED ORDER — DIPHENHYDRAMINE HCL 50 MG/ML IJ SOLN: 12.5000 mg | INTRAMUSCULAR | Status: DC | PRN

## 2018-01-27 MED ORDER — PHENYLEPHRINE 40 MCG/ML (10ML) SYRINGE FOR IV PUSH (FOR BLOOD PRESSURE SUPPORT)
PREFILLED_SYRINGE | INTRAVENOUS | Status: AC
  Filled 2018-01-27: qty 10

## 2018-01-27 MED ORDER — ACETAMINOPHEN 325 MG PO TABS: 650.0000 mg | ORAL_TABLET | ORAL | Status: DC | PRN

## 2018-01-27 MED ORDER — FENTANYL 2.5 MCG/ML BUPIVACAINE 1/10 % EPIDURAL INFUSION (WH - ANES)
14.0000 mL/h | INTRAMUSCULAR | Status: DC | PRN
  Administered 2018-01-27: 14 mL/h via EPIDURAL

## 2018-01-27 MED ORDER — OXYTOCIN 40 UNITS IN LACTATED RINGERS INFUSION - SIMPLE MED
2.5000 [IU]/h | INTRAVENOUS | Status: DC
  Filled 2018-01-27: qty 1000

## 2018-01-27 MED ORDER — OXYTOCIN 40 UNITS IN LACTATED RINGERS INFUSION - SIMPLE MED
1.0000 m[IU]/min | INTRAVENOUS | Status: DC
  Administered 2018-01-27: 2 m[IU]/min via INTRAVENOUS

## 2018-01-27 MED ORDER — LIDOCAINE HCL (PF) 1 % IJ SOLN
INTRAMUSCULAR | Status: DC | PRN
  Administered 2018-01-27: 13 mL via EPIDURAL

## 2018-01-27 MED ORDER — SOD CITRATE-CITRIC ACID 500-334 MG/5ML PO SOLN: 30.0000 mL | ORAL | Status: DC | PRN

## 2018-01-27 MED ORDER — FENTANYL CITRATE (PF) 100 MCG/2ML IJ SOLN
100.0000 ug | INTRAMUSCULAR | Status: DC | PRN
  Administered 2018-01-27 (×3): 100 ug via INTRAVENOUS
  Filled 2018-01-27 (×3): qty 2

## 2018-01-27 MED ORDER — LIDOCAINE HCL (PF) 1 % IJ SOLN
30.0000 mL | INTRAMUSCULAR | Status: DC | PRN
  Filled 2018-01-27: qty 30

## 2018-01-27 MED ORDER — LACTATED RINGERS IV SOLN
500.0000 mL | INTRAVENOUS | Status: DC | PRN
  Administered 2018-01-27: 1000 mL via INTRAVENOUS

## 2018-01-27 MED ORDER — ONDANSETRON HCL 4 MG/2ML IJ SOLN: 4.0000 mg | Freq: Four times a day (QID) | INTRAMUSCULAR | Status: DC | PRN

## 2018-01-27 MED ORDER — FENTANYL 2.5 MCG/ML BUPIVACAINE 1/10 % EPIDURAL INFUSION (WH - ANES)
INTRAMUSCULAR | Status: AC
  Filled 2018-01-27: qty 100

## 2018-01-27 MED ORDER — TERBUTALINE SULFATE 1 MG/ML IJ SOLN
0.2500 mg | Freq: Once | INTRAMUSCULAR | Status: DC | PRN
  Filled 2018-01-27: qty 1

## 2018-01-27 MED ORDER — MISOPROSTOL 25 MCG QUARTER TABLET
25.0000 ug | ORAL_TABLET | ORAL | Status: DC | PRN
  Filled 2018-01-27: qty 1

## 2018-01-27 MED ORDER — LACTATED RINGERS IV SOLN: 500.0000 mL | Freq: Once | INTRAVENOUS | Status: DC

## 2018-01-27 MED ORDER — TERBUTALINE SULFATE 1 MG/ML IJ SOLN
0.2500 mg | Freq: Once | INTRAMUSCULAR | Status: AC | PRN
  Administered 2018-01-27: 0.25 mg via SUBCUTANEOUS
  Filled 2018-01-27: qty 1

## 2018-01-27 MED ORDER — MISOPROSTOL 50MCG HALF TABLET
50.0000 ug | ORAL_TABLET | ORAL | Status: DC | PRN
  Administered 2018-01-27: 50 ug via ORAL
  Filled 2018-01-27 (×2): qty 1

## 2018-01-27 MED ORDER — ZOLPIDEM TARTRATE 5 MG PO TABS: 5.0000 mg | ORAL_TABLET | Freq: Every evening | ORAL | Status: DC | PRN

## 2018-01-27 NOTE — H&P (Addendum)
Sierra Daniels is a 22 y.o. female G1P0 with IUP at 31w1dpresenting for IOL for A2DM. [facility] Prenatal History/Complications:   first pregnancy GDM, on metformin EFW 81%  Past Medical History: Past Medical History:  Diagnosis Date  . Gestational diabetes    metformin  . Scoliosis     Past Surgical History: Past Surgical History:  Procedure Laterality Date  . NO PAST SURGERIES      Obstetrical History: OB History    Gravida  1   Para      Term      Preterm      AB      Living  0     SAB      TAB      Ectopic      Multiple      Live Births              Social History: Social History   Socioeconomic History  . Marital status: Single    Spouse name: Not on file  . Number of children: Not on file  . Years of education: Not on file  . Highest education level: Not on file  Occupational History  . Not on file  Social Needs  . Financial resource strain: Not hard at all  . Food insecurity:    Worry: Never true    Inability: Never true  . Transportation needs:    Medical: No    Non-medical: Not on file  Tobacco Use  . Smoking status: Former Smoker    Types: Cigars    Last attempt to quit: 08/02/2017    Years since quitting: 0.4  . Smokeless tobacco: Never Used  Substance and Sexual Activity  . Alcohol use: Not Currently    Frequency: Never  . Drug use: Never  . Sexual activity: Yes    Birth control/protection: Injection  Lifestyle  . Physical activity:    Days per week: Not on file    Minutes per session: Not on file  . Stress: Only a little  Relationships  . Social connections:    Talks on phone: Not on file    Gets together: Not on file    Attends religious service: Not on file    Active member of club or organization: Not on file    Attends meetings of clubs or organizations: Not on file    Relationship status: Not on file  Other Topics Concern  . Not on file  Social History Narrative  . Not on file    Family  History: Family History  Problem Relation Age of Onset  . Arthritis Mother   . Diabetes Mother   . Anxiety disorder Neg Hx   . Alcohol abuse Neg Hx   . ADD / ADHD Neg Hx   . Asthma Neg Hx   . Birth defects Neg Hx   . Cancer Neg Hx   . COPD Neg Hx   . Depression Neg Hx   . Drug abuse Neg Hx   . Early death Neg Hx   . Hearing loss Neg Hx   . Heart disease Neg Hx   . Hypertension Neg Hx   . Hyperlipidemia Neg Hx   . Intellectual disability Neg Hx   . Kidney disease Neg Hx   . Miscarriages / Stillbirths Neg Hx   . Learning disabilities Neg Hx   . Obesity Neg Hx   . Stroke Neg Hx   . Vision loss Neg Hx   .  Varicose Veins Neg Hx     Allergies: No Known Allergies  Medications Prior to Admission  Medication Sig Dispense Refill Last Dose  . ACCU-CHEK FASTCLIX LANCETS MISC Use as directed to test blood glucose 4 times daily. 100 each 12 Taking  . Blood Glucose Monitoring Suppl (ACCU-CHEK COMPACT CARE KIT) KIT Uses as directed to test blood glucose 4 times daily. 1 each 0 Taking  . clotrimazole (GYNE-LOTRIMIN) 1 % vaginal cream Place 1 Applicatorful vaginally at bedtime. 30 g 2 Taking  . glucose blood (ACCU-CHEK COMPACT PLUS) test strip Use as instructed to test blood glucose 4 times daily. 100 each 12 Taking  . Lancets Misc. (ACCU-CHEK FASTCLIX LANCET) KIT Use to test blood glucose 4 times daily. 1 kit 0 Taking  . metFORMIN (GLUCOPHAGE) 500 MG tablet Take 1 tablet (500 mg total) by mouth daily with breakfast. 30 tablet 3 Taking  . Prenatal Vit-Fe Fumarate-FA (PRENATAL MULTIVITAMIN) TABS tablet Take 1 tablet by mouth daily at 12 noon.   Taking        Review of Systems   Constitutional: Negative for fever and chills Eyes: Negative for visual disturbances Respiratory: Negative for shortness of breath, dyspnea Cardiovascular: Negative for chest pain or palpitations  Gastrointestinal: Negative for abdominal pain, vomiting, diarrhea and constipation.   Genitourinary: Negative  for dysuria and urgency Musculoskeletal: Negative for back pain, joint pain, myalgias  Neurological: Negative for dizziness and headaches      Last menstrual period 04/28/2017. General appearance: alert, cooperative and no distress Lungs: clear to auscultation bilaterally Heart: regular rate and rhythm Abdomen: soft, non-tender; bowel sounds normal Extremities: Homans sign is negative, no sign of DVT DTR's 2+ Presentation: cephalic Fetal monitoring  Baseline: 145 bpm, Variability: Good {> 6 bpm), Accelerations: Reactive and Decelerations: Absent Uterine activity  None      Prenatal labs: ABO, Rh: B/Positive/-- (05/14 6294) Antibody: Negative (05/14 0938) Rubella: immune RPR: Non Reactive (08/20 0909)  HBsAg: Negative (05/14 0938)  HIV: Non Reactive (08/20 0909)  GBS: Negative (10/08 1544)    Prenatal Transfer Tool  Maternal Diabetes: Yes:  Diabetes Type:  Insulin/Medication controlled Genetic Screening: Normal Maternal Ultrasounds/Referrals: Normal Fetal Ultrasounds or other Referrals:  None Maternal Substance Abuse:  No Significant Maternal Medications:  None Significant Maternal Lab Results: Lab values include: Group B Strep negative   Nursing Staff Provider  Office Location  [place] Dating    LMP  Language  English  Anatomy US   normal and fup normal  Flu Vaccine  Declined  Genetic Screen  NIPS: low risk   AFP:       TDaP vaccine   11/17/17 Hgb A1C or  GTT Early  Third trimester 111 157 116  Rhogam   n/a   LAB RESULTS   Feeding Plan  Breast Blood Type  B pos  Contraception  IUD Antibody   neg  Circumcision   N/a female Rubella  Immune  Pediatrician   list given RPR    RN  Support Person Marjory Lies HBsAg    NR  Prenatal Classes  too late  HIV   Neg  BTL Consent  GBS  (For PCN allergy, check sensitivities) Neg  VBAC Consent  Pap  Normal may 2019    Hgb Electro   normal    CF normal    SMA normal    Waterbirth  _0  Class _1  Consent _2  CNM visit    No results  found for this or any previous visit (from  the past 24 hour(s)).  Assessment: Sierra Daniels is a 22 y.o. G1P0 with an IUP at 68w1dpresenting for IOL for A2DM.  Plan: #Labor: Cytotec->Foley->pitocin #Pain:  Per request #FWB Cat 1   FChristin Fudge10/25/2019, 12:48 AM

## 2018-01-27 NOTE — Anesthesia Preprocedure Evaluation (Signed)
Anesthesia Evaluation  Patient identified by MRN, date of birth, ID band Patient awake    Reviewed: Allergy & Precautions, NPO status , Patient's Chart, lab work & pertinent test results  Airway Mallampati: II  TM Distance: >3 FB Neck ROM: Full    Dental no notable dental hx.    Pulmonary neg pulmonary ROS, former smoker,    Pulmonary exam normal breath sounds clear to auscultation       Cardiovascular negative cardio ROS Normal cardiovascular exam Rhythm:Regular Rate:Normal     Neuro/Psych negative neurological ROS  negative psych ROS   GI/Hepatic negative GI ROS, Neg liver ROS,   Endo/Other  negative endocrine ROSdiabetes, Gestational  Renal/GU negative Renal ROS  negative genitourinary   Musculoskeletal negative musculoskeletal ROS (+)   Abdominal   Peds negative pediatric ROS (+)  Hematology negative hematology ROS (+)   Anesthesia Other Findings   Reproductive/Obstetrics (+) Pregnancy                             Anesthesia Physical Anesthesia Plan  ASA: II  Anesthesia Plan: Epidural   Post-op Pain Management:    Induction:   PONV Risk Score and Plan:   Airway Management Planned:   Additional Equipment:   Intra-op Plan:   Post-operative Plan:   Informed Consent:   Plan Discussed with:   Anesthesia Plan Comments:         Anesthesia Quick Evaluation

## 2018-01-27 NOTE — Anesthesia Pain Management Evaluation Note (Signed)
  CRNA Pain Management Visit Note  Patient: Sierra Daniels, 22 y.o., female  "Hello I am a member of the anesthesia team at [facility]. We have an anesthesia team available at all times to provide care throughout the hospital, including epidural management and anesthesia for C-section. I don't know your plan for the delivery whether it a natural birth, water birth, IV sedation, nitrous supplementation, doula or epidural, but we want to meet your pain goals."   1.Was your pain managed to your expectations on prior hospitalizations?   No prior hospitalizations  2.What is your expectation for pain management during this hospitalization?     Epidural  3.How can we help you reach that goal? epidural  Record the patient's initial score and the patient's pain goal.   Pain: 0  Pain Goal: 5 [facility] wants you to be able to say your pain was always managed very well.  Eugenie Harewood 01/27/2018

## 2018-01-27 NOTE — Progress Notes (Signed)
Manus Gunning CNM notified about CBG. No orders at this time. Pt. Educated on carb modified diet. Will continue to monitor.

## 2018-01-27 NOTE — Progress Notes (Signed)
Vitals:   01/27/18 0543 01/27/18 0622  BP: 118/76 (!) 102/55  Pulse: 71 93  Resp: 16 15  Temp: 98.4 F (36.9 C)    FHR w/variable decels w/ctx, q 5-35minutes, FHR 160-180, variability minimal to moderate.  Has had position changes, IVF bolus. Dr. Elly Modena reviewed strip.  Will give terbutaline and 02, hold off on cytotec for now.

## 2018-01-27 NOTE — Anesthesia Procedure Notes (Signed)
Epidural Patient location during procedure: OB Start time: 01/27/2018 7:48 PM End time: 01/27/2018 8:01 PM  Staffing Anesthesiologist: Lynda Rainwater, MD Performed: anesthesiologist   Preanesthetic Checklist Completed: patient identified, site marked, surgical consent, pre-op evaluation, timeout performed, IV checked, risks and benefits discussed and monitors and equipment checked  Epidural Patient position: sitting Prep: ChloraPrep Patient monitoring: heart rate, cardiac monitor, continuous pulse ox and blood pressure Approach: midline Location: L2-L3 Injection technique: LOR saline  Needle:  Needle type: Tuohy  Needle gauge: 17 G Needle length: 9 cm Needle insertion depth: 5 cm Catheter type: closed end flexible Catheter size: 20 Guage Catheter at skin depth: 9 cm Test dose: negative  Assessment Events: blood not aspirated, injection not painful, no injection resistance, negative IV test and no paresthesia  Additional Notes Reason for block:procedure for pain

## 2018-01-27 NOTE — Progress Notes (Signed)
LABOR PROGRESS NOTE  Sierra Daniels is a 22 y.o. G1P0 at [redacted]w[redacted]d  admitted for IOL due to A2GDM.   Subjective: Doing well, feeling cramping and contractions. Able to breath through and watch tv.   Objective: BP 113/76   Pulse 97   Temp (!) 97.4 F (36.3 C) (Oral)   Resp 18   Ht 5\' 7"  (1.702 m)   Wt 87.3 kg   LMP 04/28/2017 (Exact Date)   BMI 30.15 kg/m  or  Vitals:   01/27/18 1324 01/27/18 1437 01/27/18 1530 01/27/18 1531  BP: 118/61 113/65 113/76 113/76  Pulse: 87 88 97 97  Resp:      Temp: (!) 97.4 F (36.3 C)     TempSrc: Oral     Weight:      Height:        Dilation: 1 Effacement (%): 50 Cervical Position: Posterior Station: -1 Presentation: Vertex Exam by:: Dr. Higinio Plan FHT: baseline rate 140, moderate varibility, +acel, -decel Toco: every 2-8 minutes  Labs: Lab Results  Component Value Date   WBC 7.7 01/27/2018   HGB 11.8 (L) 01/27/2018   HCT 35.2 (L) 01/27/2018   MCV 80.9 01/27/2018   PLT 253 01/27/2018    Patient Active Problem List   Diagnosis Date Noted  . Gestational diabetes 01/27/2018  . Scoliosis of thoracic spine 01/10/2018  . Gestational diabetes mellitus (GDM) in third trimester controlled on oral hypoglycemic drug 12/04/2017  . Supervision of low-risk pregnancy 08/16/2017  . Depressed mood 08/16/2017    Assessment / Plan: 22 y.o. G1P0 at [redacted]w[redacted]d here for IOL due to A2GDM.   Labor: IOL, s/p cytotec. FB still in place. Will start pit 2x2.  Fetal Wellbeing:  Cat 1 strip  Pain Control:  IV fent PRN, desires epidural  Anticipated MOD:  NSVD  GDM: stable, last glucose 85. CBG q4 during latent labor.   Darrelyn Hillock, D.O. Family Medicine PGY-1  01/27/2018, 3:39 PM

## 2018-01-27 NOTE — Progress Notes (Signed)
LABOR PROGRESS NOTE  Robertha Staples is a 22 y.o. G1P0 at [redacted]w[redacted]d  admitted for IOL for A2GDM.   Subjective: Doing well, not really feeling any contractions.   Objective: BP (!) 115/59   Pulse (!) 106   Temp (!) 97.3 F (36.3 C) (Oral)   Resp 18   Ht 5\' 7"  (1.702 m)   Wt 87.3 kg   LMP 04/28/2017 (Exact Date)   BMI 30.15 kg/m  or  Vitals:   01/27/18 0428 01/27/18 0543 01/27/18 0622 01/27/18 0802  BP: 130/72 118/76 (!) 102/55 (!) 115/59  Pulse: 84 71 93 (!) 106  Resp: 16 16 15 18   Temp: 98 F (36.7 C) 98.4 F (36.9 C)  (!) 97.3 F (36.3 C)  TempSrc: Oral Oral  Oral  Weight:      Height:        Dilation: 1 Effacement (%): 50 Cervical Position: Posterior Station: -1 Presentation: Vertex Exam by:: Dr. Higinio Plan FHT: baseline rate 140, moderate varibility, +acel, -decel Toco: Irregular   Labs: Lab Results  Component Value Date   WBC 7.7 01/27/2018   HGB 11.8 (L) 01/27/2018   HCT 35.2 (L) 01/27/2018   MCV 80.9 01/27/2018   PLT 253 01/27/2018    Patient Active Problem List   Diagnosis Date Noted  . Gestational diabetes 01/27/2018  . Scoliosis of thoracic spine 01/10/2018  . Gestational diabetes mellitus (GDM) in third trimester controlled on oral hypoglycemic drug 12/04/2017  . Supervision of low-risk pregnancy 08/16/2017  . Depressed mood 08/16/2017    Assessment / Plan: 22 y.o. G1P0 at [redacted]w[redacted]d here for IOL for A2GDM. GBS negative.   Labor: S/p cytotec x1, had recurrent variables with contractions during this. Placed FB @0900  with speculum.  Fetal Wellbeing:  Cat 1 strip currently  Pain Control:  IV fent PRN  Anticipated MOD:  NSVD   1. A2GDM: Stable, last CBG 129. Will monitor closely for elevated glucose with consideration for starting glucose stabilizer. 160 earlier after eating some extra carbs/sweets.    Darrelyn Hillock D.O. Family Medicine PGY-1   01/27/2018, 11:22 AM

## 2018-01-28 LAB — GLUCOSE, CAPILLARY: Glucose-Capillary: 134 mg/dL — ABNORMAL HIGH (ref 70–99)

## 2018-01-28 MED ORDER — WITCH HAZEL-GLYCERIN EX PADS: 1.0000 "application " | MEDICATED_PAD | CUTANEOUS | Status: DC | PRN

## 2018-01-28 MED ORDER — COCONUT OIL OIL: 1.0000 "application " | TOPICAL_OIL | Status: DC | PRN

## 2018-01-28 MED ORDER — SENNOSIDES-DOCUSATE SODIUM 8.6-50 MG PO TABS
2.0000 | ORAL_TABLET | ORAL | Status: DC
  Administered 2018-01-28: 2 via ORAL
  Filled 2018-01-28 (×2): qty 2

## 2018-01-28 MED ORDER — ONDANSETRON HCL 4 MG PO TABS: 4.0000 mg | ORAL_TABLET | ORAL | Status: DC | PRN

## 2018-01-28 MED ORDER — IBUPROFEN 600 MG PO TABS
600.0000 mg | ORAL_TABLET | Freq: Four times a day (QID) | ORAL | Status: DC
  Administered 2018-01-28 – 2018-01-29 (×5): 600 mg via ORAL
  Filled 2018-01-28 (×6): qty 1

## 2018-01-28 MED ORDER — ONDANSETRON HCL 4 MG/2ML IJ SOLN: 4.0000 mg | INTRAMUSCULAR | Status: DC | PRN

## 2018-01-28 MED ORDER — PRENATAL MULTIVITAMIN CH
1.0000 | ORAL_TABLET | Freq: Every day | ORAL | Status: DC
  Administered 2018-01-28 – 2018-01-29 (×2): 1 via ORAL
  Filled 2018-01-28 (×2): qty 1

## 2018-01-28 MED ORDER — TETANUS-DIPHTH-ACELL PERTUSSIS 5-2.5-18.5 LF-MCG/0.5 IM SUSP: 0.5000 mL | Freq: Once | INTRAMUSCULAR | Status: DC

## 2018-01-28 MED ORDER — ZOLPIDEM TARTRATE 5 MG PO TABS: 5.0000 mg | ORAL_TABLET | Freq: Every evening | ORAL | Status: DC | PRN

## 2018-01-28 MED ORDER — SIMETHICONE 80 MG PO CHEW: 80.0000 mg | CHEWABLE_TABLET | ORAL | Status: DC | PRN

## 2018-01-28 MED ORDER — DIBUCAINE 1 % RE OINT: 1.0000 "application " | TOPICAL_OINTMENT | RECTAL | Status: DC | PRN

## 2018-01-28 MED ORDER — ACETAMINOPHEN 325 MG PO TABS
650.0000 mg | ORAL_TABLET | ORAL | Status: DC | PRN
  Administered 2018-01-28: 650 mg via ORAL
  Filled 2018-01-28: qty 2

## 2018-01-28 MED ORDER — DIPHENHYDRAMINE HCL 25 MG PO CAPS: 25.0000 mg | ORAL_CAPSULE | Freq: Four times a day (QID) | ORAL | Status: DC | PRN

## 2018-01-28 MED ORDER — BENZOCAINE-MENTHOL 20-0.5 % EX AERO: 1.0000 "application " | INHALATION_SPRAY | CUTANEOUS | Status: DC | PRN

## 2018-01-28 NOTE — Progress Notes (Signed)
POSTPARTUM PROGRESS NOTE  Post Partum Day 1  Subjective:  Sierra Daniels is a 22 y.o. G1P1001 s/p SVD at [redacted]w[redacted]d.  She reports she is doing well. No acute events overnight. She denies any problems with ambulating, voiding or po intake. Denies nausea or vomiting.  Pain is well controlled.  Lochia is appropriate.  Objective: Blood pressure (!) 110/54, pulse 80, temperature 97.9 F (36.6 C), temperature source Oral, resp. rate 17, height 5\' 7"  (1.702 m), weight 87.3 kg, last menstrual period 04/28/2017, SpO2 99 %, unknown if currently breastfeeding.  Physical Exam:  General: alert, cooperative and no distress Chest: no respiratory distress Heart:regular rate, distal pulses intact Abdomen: soft, nontender,  Uterine Fundus: firm, appropriately tender DVT Evaluation: No calf swelling or tenderness Extremities: No LE edema Skin: warm, dry  Recent Labs    01/27/18 0156  HGB 11.8*  HCT 35.2*    Assessment/Plan: Sierra Daniels is a 22 y.o. G1P1001 s/p SVD at [redacted]w[redacted]d   PPD#1 - Doing well  Routine postpartum care GDM: Fasting CBG elevated at 134 this AM. Will repeat tomorrow AM. Will need 2h GTT at postpartum visit.  Contraception: IUD Feeding: breast Dispo: Plan for discharge PPD#2.   LOS: 1 day   Phill Myron, D.O. OB Fellow  01/28/2018, 11:08 AM

## 2018-01-28 NOTE — Lactation Note (Signed)
This note was copied from a baby's chart. Lactation Consultation Note  Patient Name: Sierra Daniels [id number] Date: 01/28/2018 Reason for consult: Initial assessment;Primapara;1st time breastfeeding;Term  P1 mother whose infant is now 42 hours old.  Mother's breasts are soft and non tender and nipples are everted.  Assisted baby to latch in the cross cradle hold on the left breast without difficulty.  Wide mouth, flanged lips and a few audible swallows noted.  Mother denied pain with latching but had moderate uterine cramping with feeding.  Encouraged to feed 8-12 times/24 hours or sooner if baby shows cues.  Reviewed feeding cues.  Mother is familiar with hand expression and will do this before/after feedings.  Colostrum container provided and milk storage times reviewed.    Mom made aware of O/P services, breastfeeding support groups, community resources, and our phone # for post-discharge questions. Father and support person present.  RN updated.    Maternal Data Formula Feeding for Exclusion: No Has patient been taught Hand Expression?: Yes Does the patient have breastfeeding experience prior to this delivery?: No  Feeding Feeding Type: Breast Fed Nipple Type: Slow - flow  LATCH Score Latch: Grasps breast easily, tongue down, lips flanged, rhythmical sucking.  Audible Swallowing: A few with stimulation  Type of Nipple: Everted at rest and after stimulation  Comfort (Breast/Nipple): Soft / non-tender  Hold (Positioning): Assistance needed to correctly position infant at breast and maintain latch.  LATCH Score: 8  Interventions Interventions: Breast feeding basics reviewed;Assisted with latch;Skin to skin;Breast massage;Hand express;Position options;Support pillows;Adjust position;Breast compression  Lactation Tools Discussed/Used WIC Program: Yes   Consult Status Consult Status: Follow-up Date: 01/29/18 Follow-up type: In-patient    Sierra Daniels 01/28/2018,  12:25 PM

## 2018-01-28 NOTE — Anesthesia Postprocedure Evaluation (Signed)
Anesthesia Post Note  Patient: Technical sales engineer  Procedure(s) Performed: AN AD [place]     Patient location during evaluation: Mother Baby Anesthesia Type: Epidural Level of consciousness: awake and alert Pain management: pain level controlled Vital Signs Assessment: post-procedure vital signs reviewed and stable Respiratory status: spontaneous breathing, nonlabored ventilation and respiratory function stable Cardiovascular status: stable Postop Assessment: no headache, no backache, epidural receding and patient able to bend at knees Anesthetic complications: no    Last Vitals:  Vitals:   01/28/18 0118 01/28/18 0538  BP: 118/75 (!) 110/54  Pulse: 93 80  Resp: 16 17  Temp: 36.7 C 36.6 C  SpO2: 100% 99%    Last Pain:  Vitals:   01/28/18 0720  TempSrc:   PainSc: 0-No pain   Pain Goal: Patients Stated Pain Goal: 0 (01/28/18 0015)               Rayvon Char

## 2018-01-29 LAB — GLUCOSE, CAPILLARY: GLUCOSE-CAPILLARY: 111 mg/dL — AB (ref 70–99)

## 2018-01-29 MED ORDER — ACETAMINOPHEN 325 MG PO TABS: 650.0000 mg | ORAL_TABLET | ORAL | 3 refills | Status: DC | PRN

## 2018-01-29 MED ORDER — IBUPROFEN 600 MG PO TABS: 600.0000 mg | ORAL_TABLET | Freq: Four times a day (QID) | ORAL | 2 refills | Status: DC

## 2018-01-29 NOTE — Lactation Note (Signed)
This note was copied from a baby's chart. Lactation Consultation Note: Mom reports she has pumped twice. Did not obtain any Colostrum.Encouragement given. Does not have pump for home and has not signed up for [facility] yet,. I will send referral to them since baby is in NICU. Has latched baby to the breast and reports she latched well. No questions at present. Reviewed our phone number, to call with questions/concerns  Patient Name: Sierra Daniels [id number] Date: 01/29/2018 Reason for consult: Follow-up assessment;NICU baby;Primapara   Maternal Data Does the patient have breastfeeding experience prior to this delivery?: No  Feeding Feeding Type: Formula  LATCH Score Latch: Repeated attempts needed to sustain latch, nipple held in mouth throughout feeding, stimulation needed to elicit sucking reflex.  Audible Swallowing: A few with stimulation  Type of Nipple: Everted at rest and after stimulation  Comfort (Breast/Nipple): Soft / non-tender  Hold (Positioning): Assistance needed to correctly position infant at breast and maintain latch.  LATCH Score: 7  Interventions Interventions: Breast feeding basics reviewed;Assisted with latch;Skin to skin;Hand express;Breast compression;Adjust position;Support pillows;Position options  Lactation Tools Discussed/Used     Consult Status Consult Status: Complete    Truddie Crumble 01/29/2018, 11:24 AM

## 2018-01-29 NOTE — Discharge Summary (Addendum)
Obstetrics Discharge Summary OB/GYN Faculty [facility]   Patient Name: Sierra Daniels DOB: 1995-10-08 MRN: [id number]  Date of admission: 01/27/2018 Delivering MD: Fatima Blank A   Date of discharge: 01/29/2018  Admitting diagnosis: INDUCTION Intrauterine pregnancy: [redacted]w[redacted]d    Secondary diagnosis:   Active Problems:   Gestational diabetes   NSVD (normal spontaneous vaginal delivery)   Additional problems:  . A2GDM controlled on metformin     Discharge diagnosis: Term Pregnancy Delivered and GDM A2                                            Postpartum procedures: None Complications: None  Hospital course: KIlleana Edickis a 22y.o. 343w1dho was admitted for IOL 2/2 A2GDM. Her pregnancy was complicated by only [id number] labor course and delivery were uncomplicated, she was successfully augmented with cytotec, foley, and pitocin, resulting in VD.   Her sugars remained stable intrapartum, her PPD#1 glucose was mildly elevated to 130s, down to 111 on PPD#2. Please see delivery for additional details. Her postpartum course was uncomplicated. She was breastfeeding without difficulty. By day of discharge, she was passing flatus, urinating, eating and drinking without difficulty. Her pain was well-controlled. She will follow-up in clinic in 4-6 weeks, at which point she will need a 2hr GTT.  Physical exam  Vitals:   01/28/18 0118 01/28/18 0538 01/28/18 1455 01/29/18 0623  BP: 118/75 (!) 110/54 111/63 116/73  Pulse: 93 80 69 74  Resp: _0 Temp: 98 F (36.7 C) 97.9 F (36.6 C) 99 F (37.2 C) 97.9 F (36.6 C)  TempSrc: Oral Oral Oral Oral  SpO2: 100% 99% 100%   Weight:      Height:       General: Well-appearing, NAD Lochia: appropriate Uterine Fundus: firm Incision: N/A DVT Evaluation: No evidence of DVT seen on physical exam. Labs: Lab Results  Component Value Date   WBC 7.7 01/27/2018   HGB 11.8 (L) 01/27/2018   HCT 35.2 (L) 01/27/2018   MCV 80.9 01/27/2018   PLT  253 01/27/2018   No flowsheet data found.  Discharge instructions: Per After Visit Summary and "Baby and Me Booklet"  After visit meds:  Allergies as of 01/29/2018   No Known Allergies     Medication List    STOP taking these medications   ACCU-CHEK COMPACT CARE KIT Kit   ACCU-CHEK FASTCLIX LANCET Kit   ACCU-CHEK FASTCLIX LANCETS Misc   clotrimazole 1 % vaginal cream Commonly known as:  GYNE-LOTRIMIN   glucose blood test strip   metFORMIN 500 MG tablet Commonly known as:  GLUCOPHAGE     TAKE these medications   acetaminophen 325 MG tablet Commonly known as:  TYLENOL Take 2 tablets (650 mg total) by mouth every 4 (four) hours as needed (for pain scale < 4). What changed:    medication strength  how much to take  when to take this  reasons to take this   ibuprofen 600 MG tablet Commonly known as:  ADVIL,MOTRIN Take 1 tablet (600 mg total) by mouth every 6 (six) hours.   prenatal multivitamin Tabs tablet Take 1 tablet by mouth daily at 12 noon.       Postpartum contraception: IUD still deciding Diet: Routine Diet Activity: Advance as tolerated. Pelvic rest for 6 weeks.   Outpatient follow up:4-6wk Follow-up Appt:No future appointments. Follow-up  Visit:No follow-ups on file.  Newborn Data: Live born female  Birth Weight: 7 lb 10.4 oz (3470 g) APGAR: 8, 9  Newborn Delivery   Birth date/time:  01/27/2018 21:40:00 Delivery type:  Vaginal, Spontaneous     Baby Feeding: Breast Disposition:home with mother  Mena Goes, MD OB/GYN Fellow, Faculty Practice    OB FELLOW DISCHARGE ATTESTATION  I have seen and examined this patient and agree with above documentation in the resident's note.   Phill Myron, D.O. OB Fellow  01/29/2018, 2:01 PM

## 2018-02-01 ENCOUNTER — Encounter: Payer: Self-pay | Admitting: Family Medicine

## 2018-03-06 ENCOUNTER — Telehealth: Payer: Self-pay | Admitting: *Deleted

## 2018-03-06 NOTE — Telephone Encounter (Signed)
Called pt to inform her that she needed to be fasting for her appointment on Friday 03/10/18 in order for her to have a PP 2hr gtt done.  Pt verbalized understanding.

## 2018-03-06 NOTE — Telephone Encounter (Signed)
-----   Message from Jorje Guild, NP sent at 03/06/2018 11:53 AM EST ----- This is pt is scheduled for PP visit with me on Friday. Can we make sure that she's fasting for PP 2 hr gtt

## 2018-03-10 ENCOUNTER — Ambulatory Visit (INDEPENDENT_AMBULATORY_CARE_PROVIDER_SITE_OTHER): Payer: Medicaid Other | Admitting: Student

## 2018-03-10 DIAGNOSIS — Z3202 Encounter for pregnancy test, result negative: Secondary | ICD-10-CM | POA: Diagnosis not present

## 2018-03-10 DIAGNOSIS — Z1389 Encounter for screening for other disorder: Secondary | ICD-10-CM | POA: Diagnosis not present

## 2018-03-10 DIAGNOSIS — O24415 Gestational diabetes mellitus in pregnancy, controlled by oral hypoglycemic drugs: Secondary | ICD-10-CM

## 2018-03-10 DIAGNOSIS — Z30013 Encounter for initial prescription of injectable contraceptive: Secondary | ICD-10-CM

## 2018-03-10 LAB — POCT PREGNANCY, URINE: Preg Test, Ur: NEGATIVE

## 2018-03-10 LAB — GLUCOSE, CAPILLARY: GLUCOSE-CAPILLARY: 105 mg/dL — AB (ref 70–99)

## 2018-03-10 MED ORDER — MEDROXYPROGESTERONE ACETATE 150 MG/ML IM SUSP
150.0000 mg | Freq: Once | INTRAMUSCULAR | Status: AC
  Administered 2018-03-10: 150 mg via INTRAMUSCULAR

## 2018-03-10 NOTE — Patient Instructions (Signed)

## 2018-03-10 NOTE — Progress Notes (Signed)
Subjective:     Sierra Daniels is a 22 y.o. female who presents for a postpartum visit. She is 6 weeks postpartum following a spontaneous vaginal delivery. I have fully reviewed the prenatal and intrapartum course. The delivery was at [redacted]w[redacted]d gestational weeks. Outcome: spontaneous vaginal delivery. Anesthesia: epidural. Postpartum course has been normal. Baby's course has been normal. Baby is feeding by bottle - Similac. Bleeding no bleeding. Bowel function is normal. Bladder function is normal. Patient is not sexually active. Contraception method is Depo-Provera injections. Postpartum depression screening: negative.  The following portions of the patient's history were reviewed and updated as appropriate: allergies, current medications, past family history, past medical history, past social history, past surgical history and problem list.  Review of Systems Pertinent items are noted in HPI.   Objective:    BP (!) 139/96   Pulse 77   Wt 165 lb 8 oz (75.1 kg)   LMP 03/07/2018 (Exact Date)   BMI 25.92 kg/m   General:  alert, cooperative and appears stated age  Lungs: clear to auscultation bilaterally  Heart:  regular rate and rhythm, S1, S2 normal, no murmur, click, rub or gallop  Abdomen: soft, non-tender; bowel sounds normal; no masses,  no organomegaly   Vulva:  not evaluated  Vagina: not evaluated        Assessment:     Normal postpartum exam. Pap smear not done at today's visit. Pt had normal pap smear earlier this year.  Fasting CBG 105 so proceeded with PP 2hr gtt.  Pt interested in depo provera as she has used it in the past. Will give dose with negative UPT.   Plan:  A: 1. Encounter for routine postpartum follow-up -doing wel  2. Gestational diabetes mellitus (GDM) controlled on oral hypoglycemic drug, antepartum  - Glucose tolerance, 2 hours  3. Encounter for initial prescription of injectable contraceptive  - medroxyPROGESTERone (DEPO-PROVERA) injection 150  mg   Jorje Guild, NP

## 2018-03-11 LAB — GLUCOSE TOLERANCE, 2 HOURS
Glucose, 2 hour: 86 mg/dL (ref 65–139)
Glucose, GTT - Fasting: 96 mg/dL (ref 65–99)

## 2018-12-13 IMAGING — US US FETAL BPP W/ NON-STRESS
1 series · 14 of 14 positions shown · non-contrast
Comparison: none

[Series 1: us fetal bpp w/nonstress · 14 acquisitions, 14 frames shown]
[im 1/14]
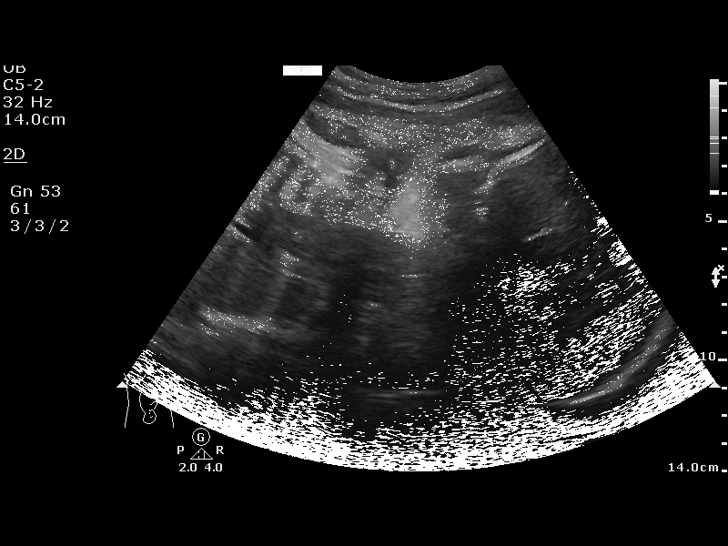
[im 2/14]
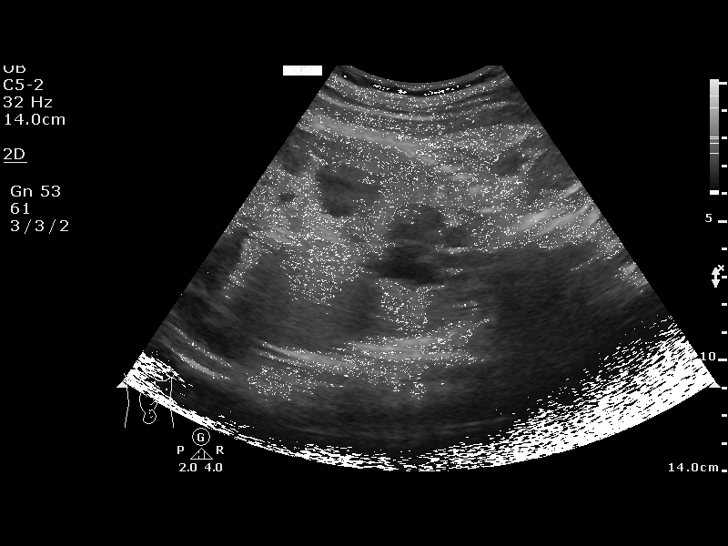
[im 3/14]
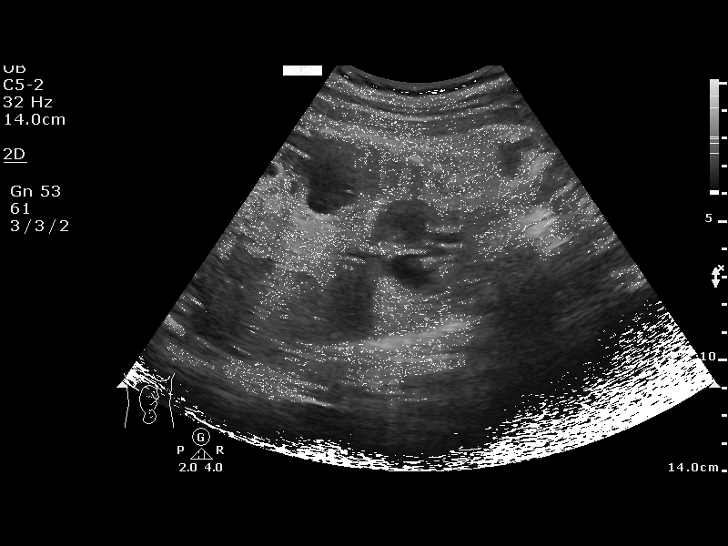
[im 4/14]
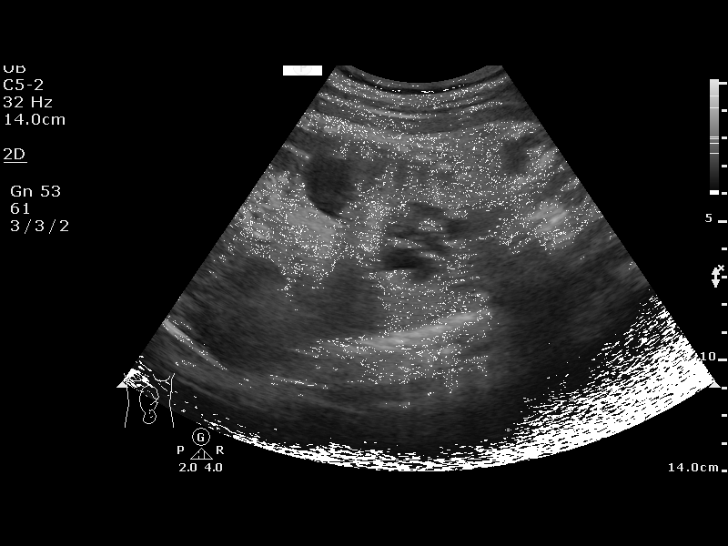
[im 5/14]
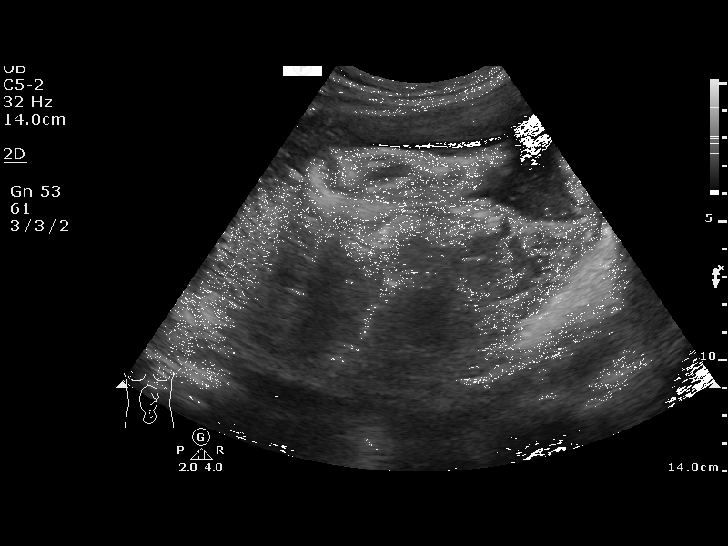
[im 6/14]
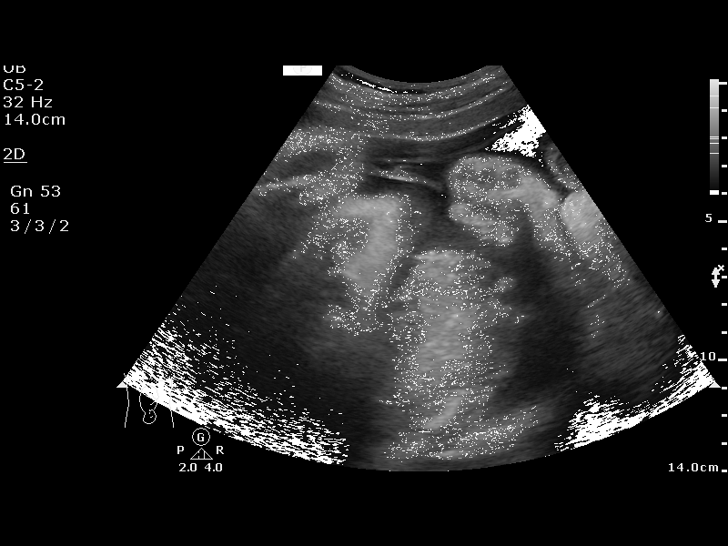
[im 7/14]
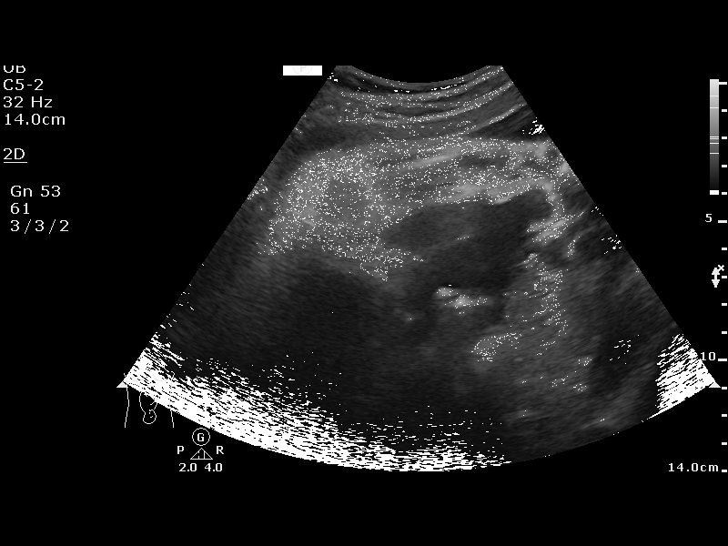
[im 8/14]
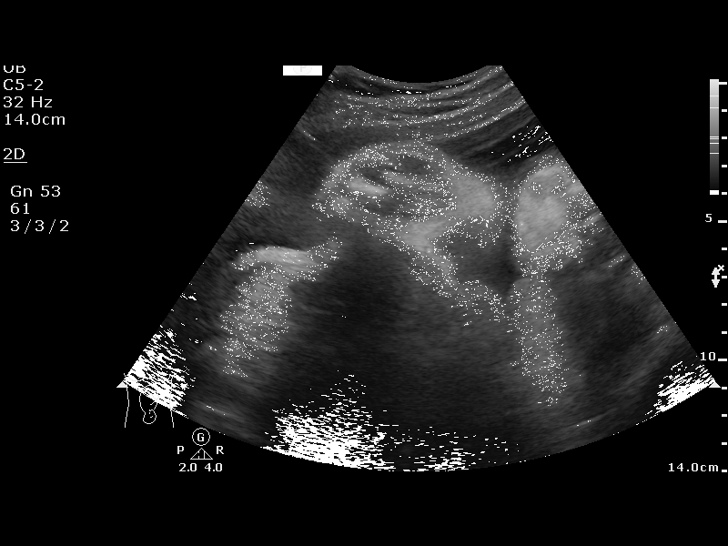
[im 9/14]
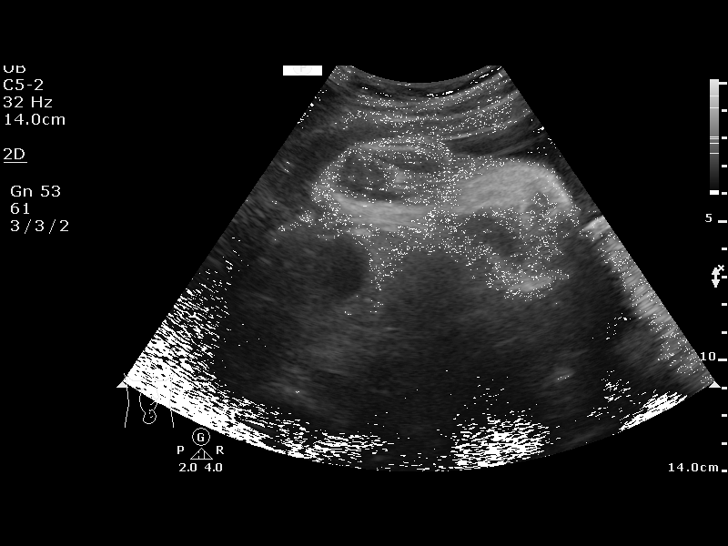
[im 10/14]
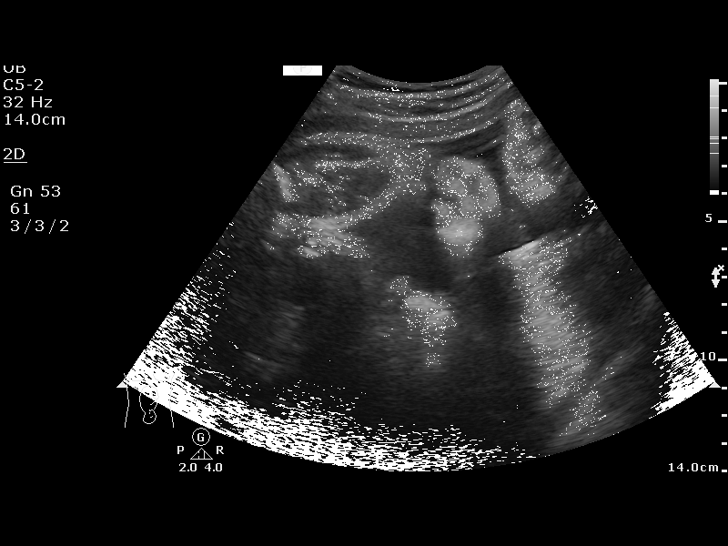
[im 11/14]
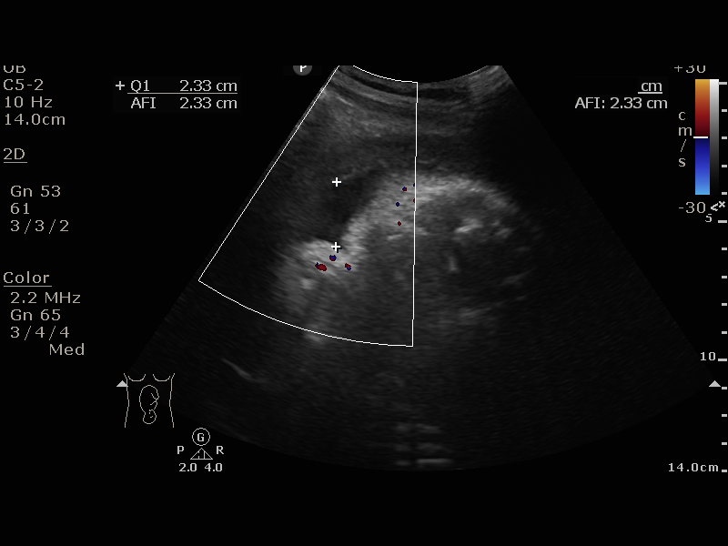
[im 12/14]
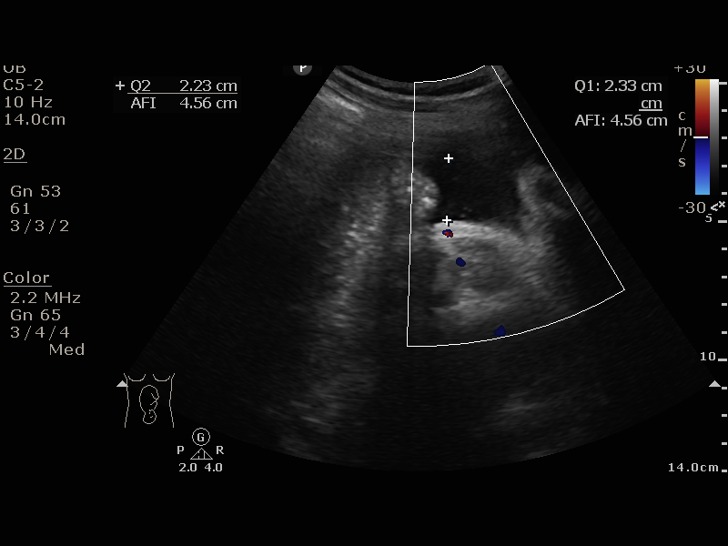
[im 13/14]
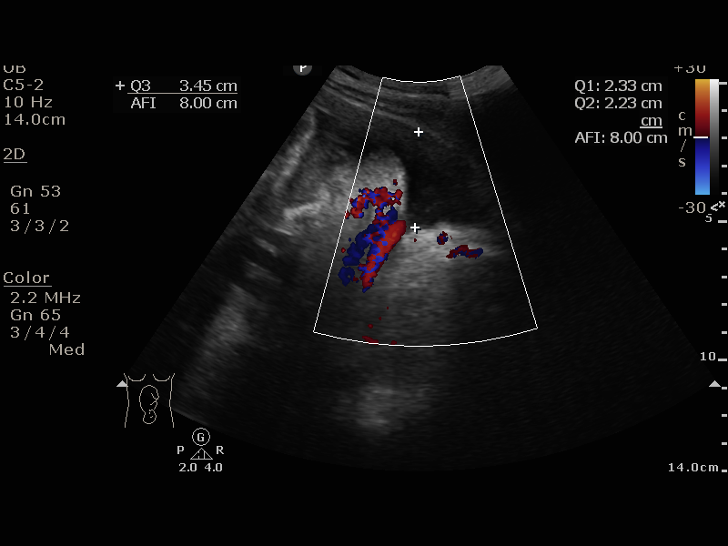
[im 14/14]
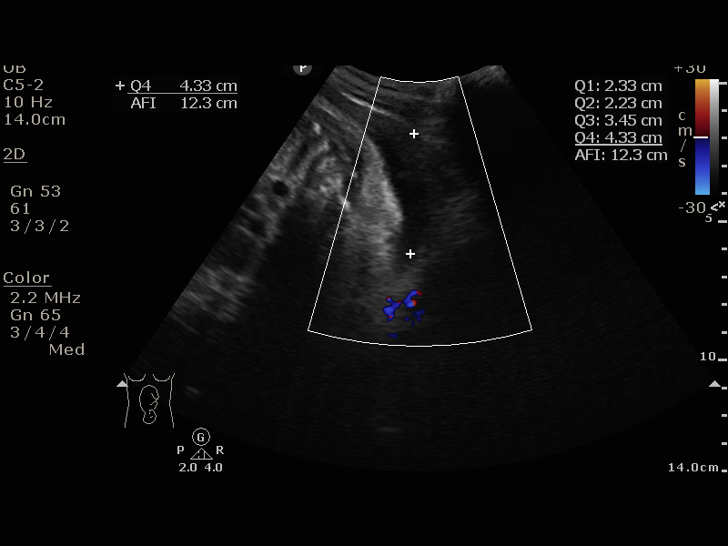

[14 of 14 positions shown; findings below may reference images not displayed]

[facility] Clinic
[facility]
[REDACTED]

Service(s) Provided

Indications

35 weeks gestation of pregnancy
Gestational diabetes in pregnancy,
controlled by oral hypoglycemic drugs
Vital Signs

Height:        5'7"
Fetal Evaluation

Num Of Fetuses:         1
Preg. Location:         Intrauterine
Cardiac Activity:       Observed
Presentation:           Cephalic

Amniotic Fluid
AFI FV:      Within normal limits

AFI Sum(cm)     %Tile       Largest Pocket(cm)
12.34           39

RUQ(cm)       RLQ(cm)       LUQ(cm)        LLQ(cm)
2.33
Biophysical Evaluation

Amniotic F.V:   Pocket => 2 cm two         F. Tone:        Observed
planes
F. Movement:    Observed                   N.S.T:          Reactive
F. Breathing:   Observed                   Score:          [DATE]
OB History

Gravidity:    1
Gestational Age

LMP:           35w 4d        Date:  04/28/17                 EDD:   02/02/18
Best:          35w 4d     Det. By:  LMP  (04/28/17)          EDD:   02/02/18
Impression

Recommendations

Continue antenatal testing
Nifasha Pacifique, DO

## 2018-12-29 IMAGING — US US FETAL BPP W/ NON-STRESS
1 series · 14 of 15 positions shown · non-contrast
Comparison: none

[Series 1: us fetal bpp w/nonstress · 15 acquisitions, 14 frames shown]
[im 1/15]
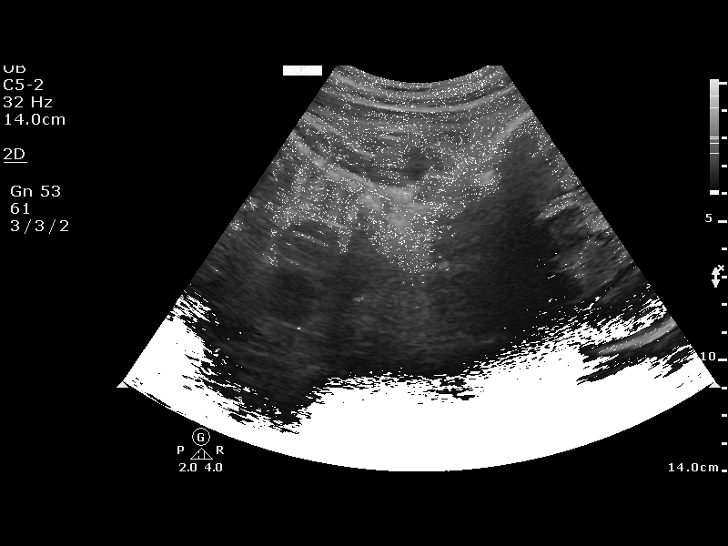
[im 2/15]
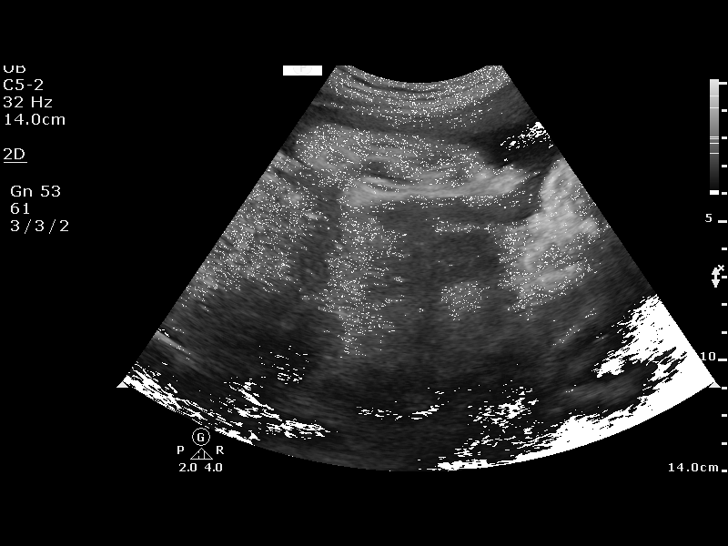
[im 3/15]
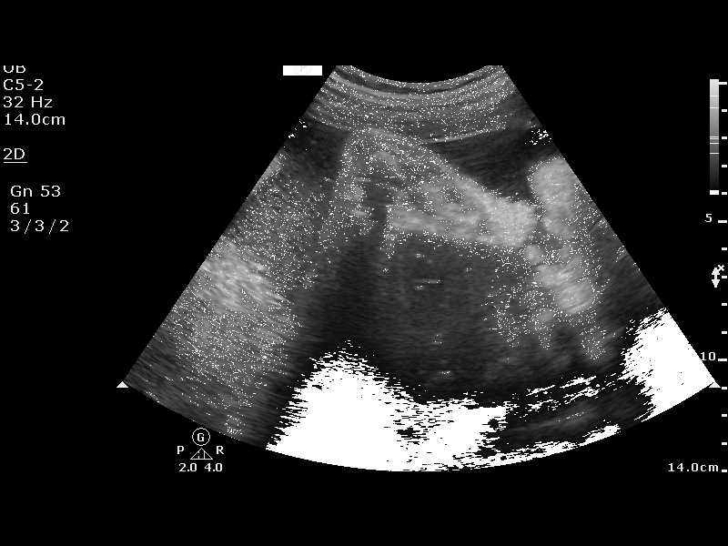
[im 4/15]
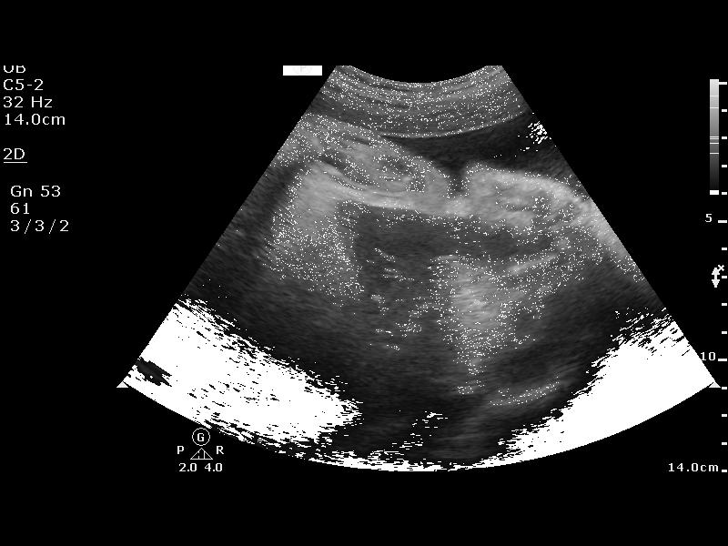
[im 5/15]
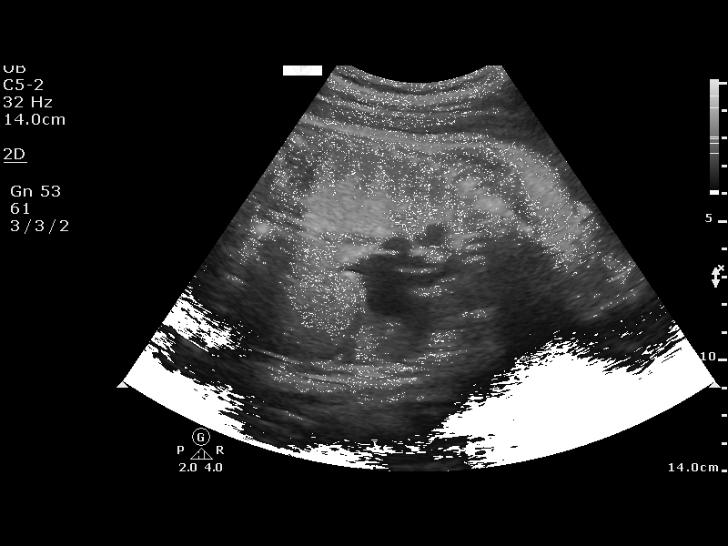
[im 6/15]
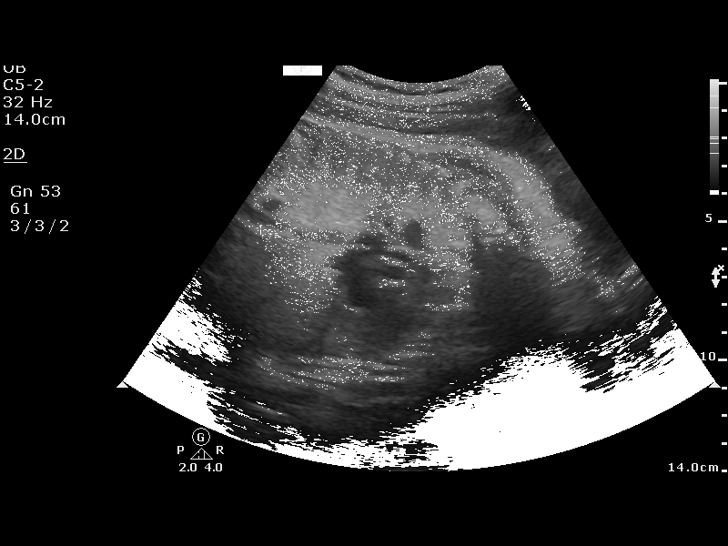
[im 7/15]
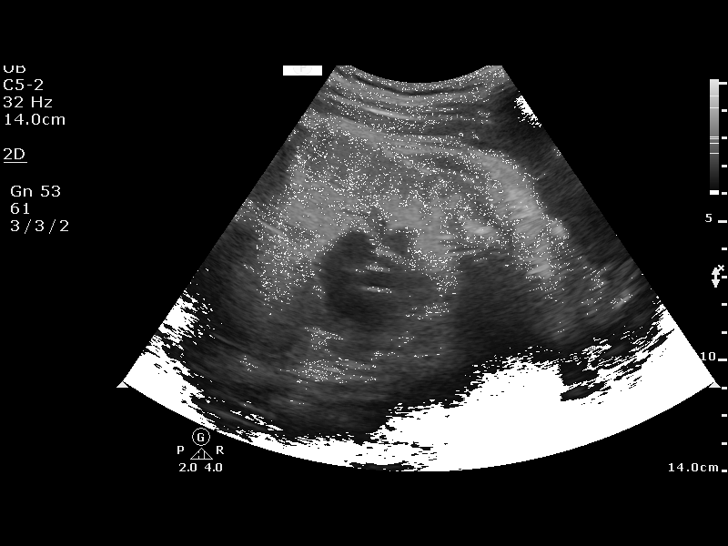
[im 9/15]
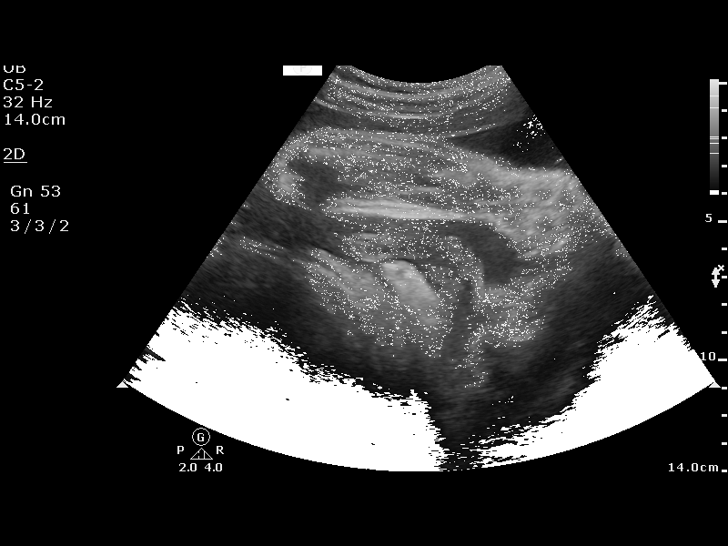
[im 10/15]
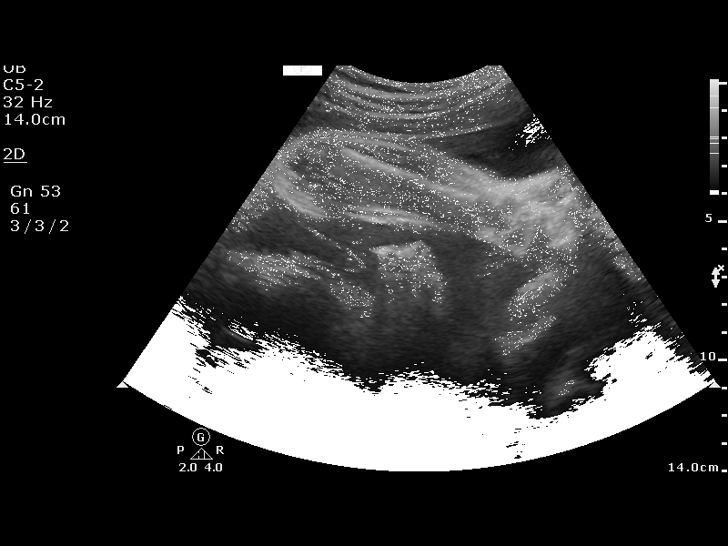
[im 11/15]
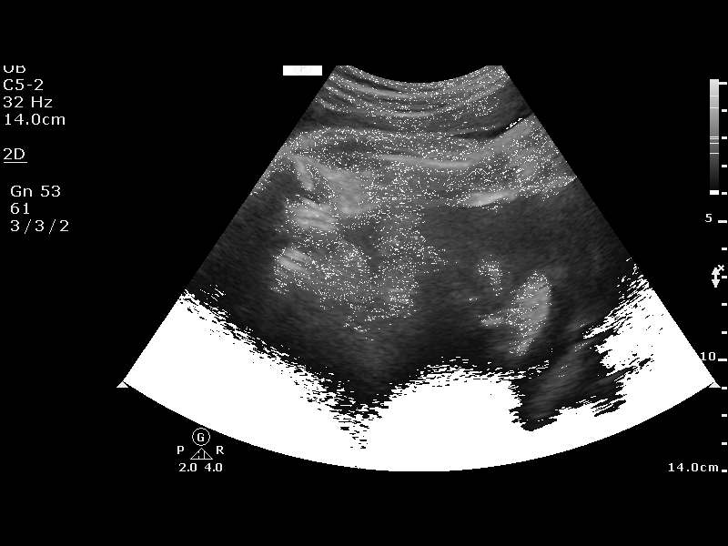
[im 12/15]
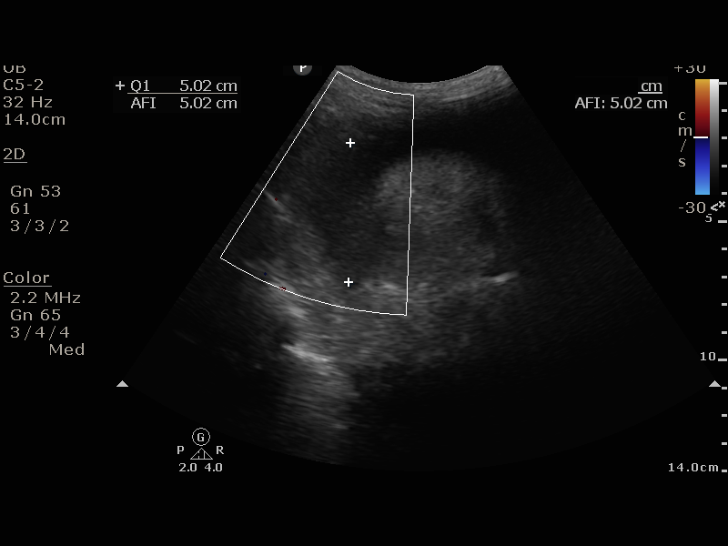
[im 13/15]
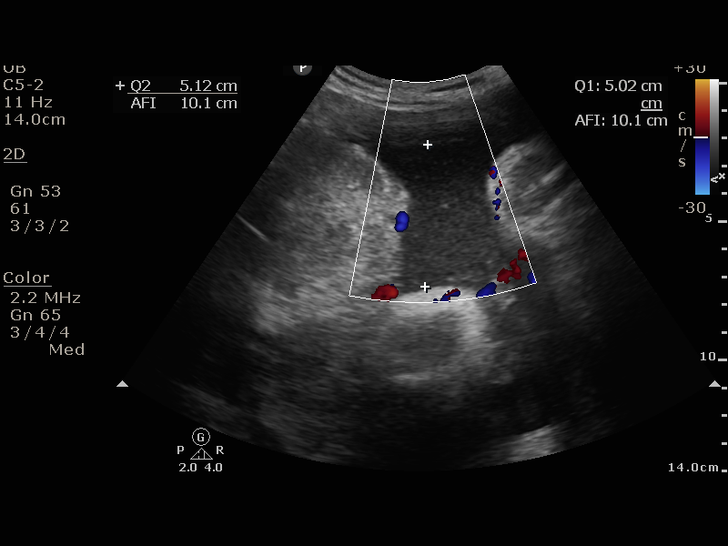
[im 14/15]
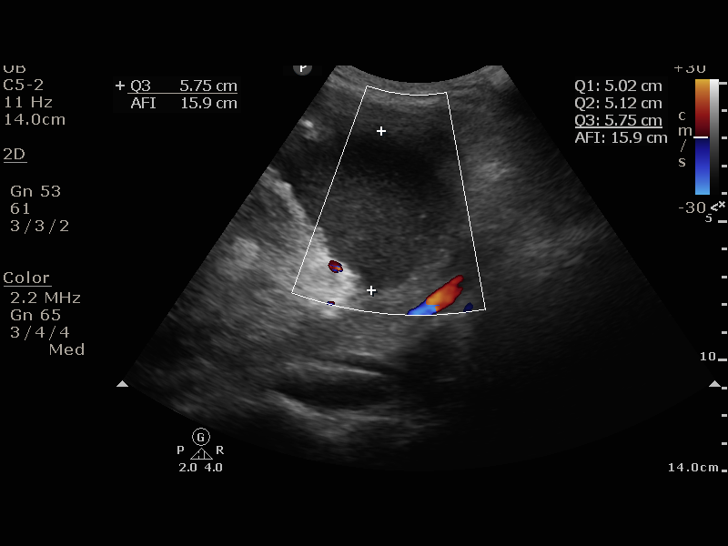
[im 15/15]
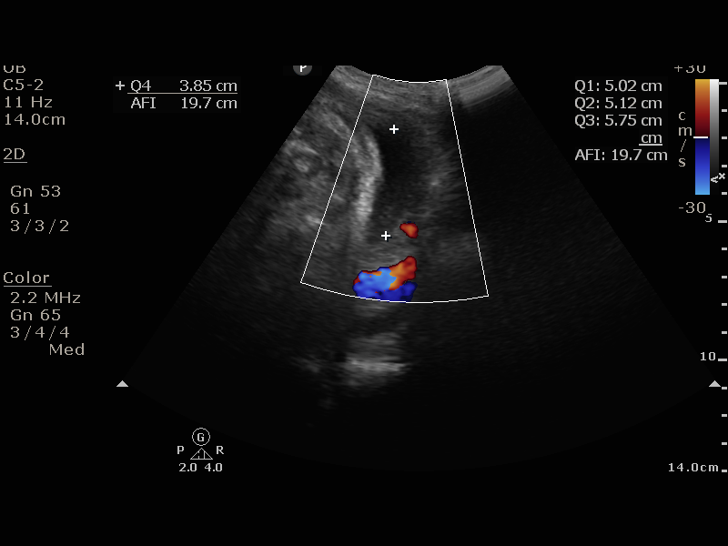

[14 of 15 positions shown; findings below may reference images not displayed]

[facility] Clinic
[facility]
[REDACTED]

1  US FETAL BPP W/NONSTRESS             76818.4      ANTEL LUCESCU

Service(s) Provided

Indications

37 weeks gestation of pregnancy
Gestational diabetes in pregnancy,
controlled by oral hypoglycemic drugs
Vital Signs

Height:        5'7"
Fetal Evaluation

Num Of Fetuses:         1
Preg. Location:         Intrauterine
Cardiac Activity:       Observed
Presentation:           Cephalic

Amniotic Fluid
AFI FV:      Within normal limits

AFI Sum(cm)     %Tile       Largest Pocket(cm)
19.74           77

RUQ(cm)       RLQ(cm)       LUQ(cm)        LLQ(cm)
5.02
Biophysical Evaluation

Amniotic F.V:   Pocket => 2 cm two         F. Tone:        Observed
planes
F. Movement:    Observed                   N.S.T:          Reactive
F. Breathing:   Observed                   Score:          [DATE]
OB History

Gravidity:    1
Gestational Age

LMP:           37w 6d        Date:  04/28/17                 EDD:   02/02/18
Best:          37w 6d     Det. By:  LMP  (04/28/17)          EDD:   02/02/18
Impression

Recommendations

Cont weekly antenatal testing.

## 2021-10-14 ENCOUNTER — Inpatient Hospital Stay (HOSPITAL_COMMUNITY)
Admission: AD | Admit: 2021-10-14 | Discharge: 2021-10-14 | Disposition: A | Discharge status: Home / Self Care | Payer: Medicaid Other | Attending: Obstetrics and Gynecology | Admitting: Obstetrics and Gynecology

## 2021-10-14 ENCOUNTER — Encounter (HOSPITAL_COMMUNITY): Payer: Self-pay | Admitting: Obstetrics and Gynecology

## 2021-10-14 ENCOUNTER — Inpatient Hospital Stay (HOSPITAL_COMMUNITY): Payer: Medicaid Other

## 2021-10-14 ENCOUNTER — Encounter (HOSPITAL_COMMUNITY): Payer: Self-pay | Admitting: Emergency Medicine

## 2021-10-14 ENCOUNTER — Other Ambulatory Visit: Payer: Self-pay

## 2021-10-14 ENCOUNTER — Ambulatory Visit (HOSPITAL_COMMUNITY)
Admission: EM | Admit: 2021-10-14 | Discharge: 2021-10-14 | Disposition: A | Discharge status: Home / Self Care | Payer: Medicaid Other | Attending: Internal Medicine | Admitting: Internal Medicine

## 2021-10-14 DIAGNOSIS — O208 Other hemorrhage in early pregnancy: Secondary | ICD-10-CM | POA: Diagnosis not present

## 2021-10-14 DIAGNOSIS — O418X12 Other specified disorders of amniotic fluid and membranes, first trimester, fetus 2: Secondary | ICD-10-CM

## 2021-10-14 DIAGNOSIS — O30041 Twin pregnancy, dichorionic/diamniotic, first trimester: Secondary | ICD-10-CM | POA: Diagnosis not present

## 2021-10-14 DIAGNOSIS — R102 Pelvic and perineal pain: Secondary | ICD-10-CM | POA: Insufficient documentation

## 2021-10-14 DIAGNOSIS — Z3A08 8 weeks gestation of pregnancy: Secondary | ICD-10-CM | POA: Diagnosis not present

## 2021-10-14 DIAGNOSIS — N939 Abnormal uterine and vaginal bleeding, unspecified: Secondary | ICD-10-CM

## 2021-10-14 DIAGNOSIS — O26891 Other specified pregnancy related conditions, first trimester: Secondary | ICD-10-CM | POA: Diagnosis not present

## 2021-10-14 DIAGNOSIS — R109 Unspecified abdominal pain: Secondary | ICD-10-CM | POA: Insufficient documentation

## 2021-10-14 DIAGNOSIS — Z3201 Encounter for pregnancy test, result positive: Secondary | ICD-10-CM

## 2021-10-14 DIAGNOSIS — O4690 Antepartum hemorrhage, unspecified, unspecified trimester: Secondary | ICD-10-CM

## 2021-10-14 LAB — CBC
HCT: 34.8 % — ABNORMAL LOW (ref 36.0–46.0)
Hemoglobin: 11.6 g/dL — ABNORMAL LOW (ref 12.0–15.0)
MCH: 28.9 pg (ref 26.0–34.0)
MCHC: 33.3 g/dL (ref 30.0–36.0)
MCV: 86.8 fL (ref 80.0–100.0)
Platelets: 276 10*3/uL (ref 150–400)
RBC: 4.01 MIL/uL (ref 3.87–5.11)
RDW: 13.7 % (ref 11.5–15.5)
WBC: 8.2 10*3/uL (ref 4.0–10.5)
nRBC: 0 % (ref 0.0–0.2)

## 2021-10-14 LAB — WET PREP, GENITAL
Sperm: NONE SEEN
Trich, Wet Prep: NONE SEEN
WBC, Wet Prep HPF POC: >=10 — AB (ref <10)
Yeast Wet Prep HPF POC: NONE SEEN

## 2021-10-14 LAB — URINALYSIS, ROUTINE W REFLEX MICROSCOPIC
Bilirubin Urine: NEGATIVE
Glucose, UA: NEGATIVE mg/dL
Ketones, ur: NEGATIVE mg/dL
Leukocytes,Ua: NEGATIVE
Nitrite: NEGATIVE
Protein, ur: NEGATIVE mg/dL
Specific Gravity, Urine: 1.026 (ref 1.005–1.030)
pH: 5 (ref 5.0–8.0)

## 2021-10-14 LAB — COMPREHENSIVE METABOLIC PANEL
ALT: 17 U/L (ref 0–44)
AST: 18 U/L (ref 15–41)
Albumin: 3.7 g/dL (ref 3.5–5.0)
Alkaline Phosphatase: 51 U/L (ref 38–126)
Anion gap: 14 (ref 5–15)
BUN: 6 mg/dL (ref 6–20)
CO2: 26 mmol/L (ref 22–32)
Calcium: 9.8 mg/dL (ref 8.9–10.3)
Chloride: 100 mmol/L (ref 98–111)
Creatinine, Ser: 0.75 mg/dL (ref 0.44–1.00)
GFR, Estimated: >60 mL/min (ref >60)
Glucose, Bld: 91 mg/dL (ref 70–99)
Potassium: 3.8 mmol/L (ref 3.5–5.1)
Sodium: 140 mmol/L (ref 135–145)
Total Bilirubin: 0.5 mg/dL (ref 0.3–1.2)
Total Protein: 6.7 g/dL (ref 6.5–8.1)

## 2021-10-14 LAB — POC URINE PREG, ED: Preg Test, Ur: POSITIVE — AB

## 2021-10-14 LAB — HCG, QUANTITATIVE, PREGNANCY: hCG, Beta Chain, Quant, S: 158619 m[IU]/mL — ABNORMAL HIGH (ref <5)

## 2021-10-14 NOTE — MAU Provider Note (Addendum)
Patient Sierra Daniels is a 26 y.o. G2P1001  At 62w0dhere with complaints of vaginal spotting off and on-again, mostly dark brown. It happened twice She denies cramping, nv, diarrhea, cough, SOB, dysuria.  She denies contractions, BRB vaginal bleeding.   She endorses recent cocaine and alcohol use but denies recent intercourse.  Patient was seen and evaluated in triage due to high acuity and high census in MAU.  History     CSN: [id number] Arrival date and time: 10/14/21 1620   None     Chief Complaint  Patient presents with   Abdominal Pain   Vaginal Bleeding   Abdominal Pain This is a new problem. The current episode started today. The problem occurs intermittently. The pain is located in the suprapubic region. The pain is at a severity of 2/10. The quality of the pain is cramping. The abdominal pain does not radiate. Pertinent negatives include no constipation, diarrhea, dysuria, fever, nausea or vomiting.  Vaginal Bleeding The patient's primary symptoms include vaginal bleeding. The patient's pertinent negatives include no pelvic pain. The current episode started today. The problem occurs rarely. Associated symptoms include abdominal pain. Pertinent negatives include no constipation, diarrhea, dysuria, fever, nausea or vomiting. The vaginal discharge was bloody. The vaginal bleeding is spotting. She has not been passing clots. She has not been passing tissue.    OB History     Gravida  2   Para  1   Term  1   Preterm      AB      Living  1      SAB      IAB      Ectopic      Multiple  0   Live Births  1           Past Medical History:  Diagnosis Date   Gestational diabetes    metformin   Scoliosis     Past Surgical History:  Procedure Laterality Date   NO PAST SURGERIES      Family History  Problem Relation Age of Onset   Arthritis Mother    Diabetes Mother    Anxiety disorder Neg Hx    Alcohol abuse Neg Hx    ADD / ADHD Neg Hx    Asthma  Neg Hx    Birth defects Neg Hx    Cancer Neg Hx    COPD Neg Hx    Depression Neg Hx    Drug abuse Neg Hx    Early death Neg Hx    Hearing loss Neg Hx    Heart disease Neg Hx    Hypertension Neg Hx    Hyperlipidemia Neg Hx    Intellectual disability Neg Hx    Kidney disease Neg Hx    Miscarriages / Stillbirths Neg Hx    Learning disabilities Neg Hx    Obesity Neg Hx    Stroke Neg Hx    Vision loss Neg Hx    Varicose Veins Neg Hx     Social History   Tobacco Use   Smoking status: Former    Types: Cigars    Quit date: 08/02/2017    Years since quitting: 4.2   Smokeless tobacco: Never  Vaping Use   Vaping Use: Never used  Substance Use Topics   Alcohol use: Not Currently   Drug use: Never    Allergies: No Known Allergies  Medications Prior to Admission  Medication Sig Dispense Refill Last Dose   acetaminophen (TYLENOL)  325 MG tablet Take 2 tablets (650 mg total) by mouth every 4 (four) hours as needed (for pain scale < 4). (Patient not taking: Reported on 03/10/2018) 30 tablet 3    ibuprofen (ADVIL,MOTRIN) 600 MG tablet Take 1 tablet (600 mg total) by mouth every 6 (six) hours. (Patient not taking: Reported on 03/10/2018) 30 tablet 2    Prenatal Vit-Fe Fumarate-FA (PRENATAL MULTIVITAMIN) TABS tablet Take 1 tablet by mouth daily at 12 noon.       Review of Systems  Constitutional:  Negative for fever.  Gastrointestinal:  Positive for abdominal pain. Negative for constipation, diarrhea, nausea and vomiting.  Genitourinary:  Positive for vaginal bleeding. Negative for dysuria and pelvic pain.   Physical Exam   Blood pressure 123/62, pulse 81, temperature 97.6 F (36.4 C), temperature source Oral, resp. rate 14, height '5\' 7"'$  (1.702 m), weight 57.9 kg, last menstrual period 08/19/2021, SpO2 99 %.  Physical Exam Constitutional:      Appearance: She is well-developed.  Abdominal:     General: Abdomen is flat.     Palpations: Abdomen is soft.     Tenderness: There is no  abdominal tenderness.  Neurological:     Mental Status: She is alert.     MAU Course  Procedures  MDM -US shows di-di twins with cardiac activity and small [place] -clue cells on wet prep; will send RX -Blood type is B pos Assessment and Plan   1. Dichorionic diamniotic twin pregnancy in first trimester   2. Subchorionic hemorrhage of placenta in first trimester, fetus 2 of multiple gestation    -patient has GC CT pending [facility] and bleeding precautions reviewed.  Discharge home Patient advised to follow-up with her OB in [place]  Patient may return to MAU as needed or if her condition were to change or worsen   Mervyn Skeeters [facility] 10/14/2021, 7:06 PM

## 2021-10-14 NOTE — ED Provider Notes (Signed)
[place]    CSN: [id number] Arrival date & time: 10/14/21  1431      History   Chief Complaint Chief Complaint  Patient presents with   Possible Pregnancy   Vaginal Bleeding    HPI Sierra Daniels is a 26 y.o. female.   Patient presents to urgent care for evaluation of vaginal bleeding in the setting of pregnancy.  Patient took a pregnancy test on October 01, 2021 at home which was positive.  She had been having some vaginal bleeding that day so she thought that the positive pregnancy test was false and she went out drinking that night.  Vaginal bleeding resolved after 2 to 3 days initially, but returned this morning.  Patient states that when she was pregnant with her daughter 3 years ago, she experienced some vaginal bleeding and "thought this was normal" and stated that "it was fine with my daughter so it must be fine with this pregnancy".  She is concerned because the vaginal bleeding that she is experiencing today is darker and thicker than the vaginal bleeding she has experienced in pregnancy in the past.  She is also reporting abdominal discomfort to the bilateral lower quadrants and states that the pain feels like a cramping sensation.  She currently rates abdominal cramping pain at a 2 on a scale of 0-10 and has not taken any medications for this prior to arrival to urgent care.  Denies lower back pain, nausea, vomiting, headache, dizziness, fatigue, and fever/chills.  She is reporting a small amount of "creamy and thin" vaginal discharge that started 2 days ago.  Patient reports history of frequent bacterial vaginitis infections.  Denies recent exposure to any STIs that she is aware of.    Possible Pregnancy  Vaginal Bleeding   Past Medical History:  Diagnosis Date   Gestational diabetes    metformin   Scoliosis     Patient Active Problem List   Diagnosis Date Noted   Scoliosis of thoracic spine 01/10/2018   Depressed mood 08/16/2017    Past Surgical History:   Procedure Laterality Date   NO PAST SURGERIES      OB History     Gravida  1   Para  1   Term  1   Preterm      AB      Living  1      SAB      IAB      Ectopic      Multiple  0   Live Births  1            Home Medications    Prior to Admission medications   Medication Sig Start Date End Date Taking? Authorizing Provider  acetaminophen (TYLENOL) 325 MG tablet Take 2 tablets (650 mg total) by mouth every 4 (four) hours as needed (for pain scale < 4). Patient not taking: Reported on 03/10/2018 01/29/18   Margaretha Sheffield, MD  ibuprofen (ADVIL,MOTRIN) 600 MG tablet Take 1 tablet (600 mg total) by mouth every 6 (six) hours. Patient not taking: Reported on 03/10/2018 01/29/18   Margaretha Sheffield, MD  Prenatal Vit-Fe Fumarate-FA (PRENATAL MULTIVITAMIN) TABS tablet Take 1 tablet by mouth daily at 12 noon.    [provider]    Family History Family History  Problem Relation Age of Onset   Arthritis Mother    Diabetes Mother    Anxiety disorder Neg Hx    Alcohol abuse Neg Hx    ADD /  ADHD Neg Hx    Asthma Neg Hx    Birth defects Neg Hx    Cancer Neg Hx    COPD Neg Hx    Depression Neg Hx    Drug abuse Neg Hx    Early death Neg Hx    Hearing loss Neg Hx    Heart disease Neg Hx    Hypertension Neg Hx    Hyperlipidemia Neg Hx    Intellectual disability Neg Hx    Kidney disease Neg Hx    Miscarriages / Stillbirths Neg Hx    Learning disabilities Neg Hx    Obesity Neg Hx    Stroke Neg Hx    Vision loss Neg Hx    Varicose Veins Neg Hx     Social History Social History   Tobacco Use   Smoking status: Former    Types: Cigars    Quit date: 08/02/2017    Years since quitting: 4.2   Smokeless tobacco: Never  Vaping Use   Vaping Use: Never used  Substance Use Topics   Alcohol use: Not Currently   Drug use: Never     Allergies   Patient has no known allergies.   Review of Systems Review of Systems  Genitourinary:  Positive for vaginal  bleeding.  Per HPI   Physical Exam Triage Vital Signs ED Triage Vitals  Enc Vitals Group     BP 10/14/21 1520 118/73     Pulse Rate 10/14/21 1520 83     Resp 10/14/21 1520 16     Temp 10/14/21 1520 98 F (36.7 C)     Temp Source 10/14/21 1520 Oral     SpO2 10/14/21 1520 99 %     Weight --      Height --      Head Circumference --      Peak Flow --      Pain Score 10/14/21 1521 2     Pain Loc --      Pain Edu? --      Excl. in [place]? --    No data found.  Updated Vital Signs BP 118/73 (BP Location: Right Arm)   Pulse 83   Temp 98 F (36.7 C) (Oral)   Resp 16   LMP 08/19/2021 (Approximate)   SpO2 99%   Breastfeeding No   Visual Acuity Right Eye Distance:   Left Eye Distance:   Bilateral Distance:    Right Eye Near:   Left Eye Near:    Bilateral Near:     Physical Exam Vitals and nursing note reviewed.  Constitutional:      Appearance: Normal appearance. She is not ill-appearing or toxic-appearing.     Comments: Very pleasant patient sitting on exam in position of comfort table in no acute distress.   HENT:     Head: Normocephalic and atraumatic.     Right Ear: Hearing and external ear normal.     Left Ear: Hearing and external ear normal.     Nose: Nose normal.     Mouth/Throat:     Lips: Pink.     Mouth: Mucous membranes are moist.  Eyes:     General: Lids are normal. Vision grossly intact. Gaze aligned appropriately.     Extraocular Movements: Extraocular movements intact.     Conjunctiva/sclera: Conjunctivae normal.  Cardiovascular:     Rate and Rhythm: Normal rate and regular rhythm.     Heart sounds: Normal heart sounds, S1 normal and S2 normal.  Pulmonary:     Effort: Pulmonary effort is normal. No respiratory distress.     Breath sounds: Normal breath sounds and air entry.  Abdominal:     General: Abdomen is flat. Bowel sounds are normal.     Palpations: Abdomen is soft.     Tenderness: There is abdominal tenderness in the right lower quadrant  and suprapubic area. There is no right CVA tenderness, left CVA tenderness or guarding. Negative signs include Murphy's sign and McBurney's sign.  Musculoskeletal:     Cervical back: Neck supple.  Skin:    General: Skin is warm and dry.     Capillary Refill: Capillary refill takes less than 2 seconds.     Findings: No rash.  Neurological:     General: No focal deficit present.     Mental Status: She is alert and oriented to person, place, and time. Mental status is at baseline.     Cranial Nerves: No dysarthria or facial asymmetry.     Gait: Gait is intact.  Psychiatric:        Mood and Affect: Mood normal.        Speech: Speech normal.        Behavior: Behavior normal.        Thought Content: Thought content normal.        Judgment: Judgment normal.      UC Treatments / Results  Labs (all labs ordered are listed, but only abnormal results are displayed) Labs Reviewed  POC URINE PREG, ED - Abnormal; Notable for the following components:      Result Value   Preg Test, Ur POSITIVE (*)    All other components within normal limits    EKG   Radiology No results found.  Procedures Procedures (including critical care time)  Medications Ordered in UC Medications - No data to display  Initial Impression / Assessment and Plan / UC Course  I have reviewed the triage vital signs and the nursing notes.  Pertinent labs & imaging results that were available during my care of the patient were reviewed by me and considered in my medical decision making (see chart for details).  1.  Vaginal bleeding in pregnancy Patient to go to the maternity assessment unit by personal vehicle for further evaluation and management of vaginal bleeding and abdominal cramping in the setting of pregnancy.  Discussed risks of deferring MAU evaluation.  Patient verbalizes understanding and agreement of plan.  No clinical indication for ambulance transport at this time as patient ambulates with a steady  gait, is not dizzy, and has stable vital signs.  Patient discharged from urgent care in stable condition and given directions to MAU.  Final Clinical Impressions(s) / UC Diagnoses   Final diagnoses:  Vaginal bleeding in pregnancy  Positive pregnancy test     Discharge Instructions      Please go to the maternity assessment unit immediately for further evaluation of vaginal bleeding and abdominal cramping in the setting of pregnancy.      ED Prescriptions   None    PDMP not reviewed this encounter.   Talbot Grumbling, [place] 10/14/21 1607

## 2021-10-14 NOTE — MAU Note (Signed)
Sierra Daniels is a 26 y.o. at 57w0dhere in MAU reporting: vaginal bleeding on June 29 and it was like a period. It stopped 2-3 days later. Now is seeing brown blood and that started today. Lower abdominal pain.   LMP: 08/19/2021 approximately  Onset of complaint: today  Pain score: 3/10  Vitals:   10/14/21 1703  BP: 123/62  Pulse: 81  Resp: 14  Temp: 97.6 F (36.4 C)  SpO2: 99%     Lab orders placed from triage: UA

## 2021-10-14 NOTE — ED Triage Notes (Signed)
Patient c/o possible pregnancy. Patient c/o vaginal bleeding that started this morning.   Patient denies N/V.   Patient endorses ABD pain.   Patient endorses "dark red blood that drops into the toilet when I use the bathroom".   Patient hasn't taken any medication for symptoms.   Patient endorses having 2 positive pregnancy test at home.

## 2021-10-14 NOTE — Discharge Instructions (Addendum)
Please go to the maternity assessment unit immediately for further evaluation of vaginal bleeding and abdominal cramping in the setting of pregnancy.

## 2021-10-14 NOTE — MAU Note (Signed)
Patient verbalized understanding of discharge instructions and signed discharged documentation.

## 2021-10-15 ENCOUNTER — Other Ambulatory Visit: Payer: Self-pay | Admitting: Student

## 2021-10-15 LAB — GC/CHLAMYDIA PROBE AMP (~~LOC~~) NOT AT ARMC
Chlamydia: NEGATIVE
Comment: NEGATIVE
Comment: NORMAL
Neisseria Gonorrhea: NEGATIVE

## 2021-10-15 LAB — ABO/RH: ABO/RH(D): B POS

## 2021-10-15 MED ORDER — METRONIDAZOLE 500 MG PO TABS: 500.0000 mg | ORAL_TABLET | Freq: Two times a day (BID) | ORAL | 0 refills | Status: DC

## 2022-02-22 ENCOUNTER — Other Ambulatory Visit: Payer: Self-pay

## 2022-02-22 ENCOUNTER — Inpatient Hospital Stay (HOSPITAL_COMMUNITY)
Admission: AD | Admit: 2022-02-22 | Discharge: 2022-02-22 | Disposition: A | Discharge status: Home / Self Care | Payer: Medicaid Other | Attending: Family Medicine | Admitting: Family Medicine

## 2022-02-22 DIAGNOSIS — O30042 Twin pregnancy, dichorionic/diamniotic, second trimester: Secondary | ICD-10-CM | POA: Diagnosis present

## 2022-02-22 DIAGNOSIS — Z3A26 26 weeks gestation of pregnancy: Secondary | ICD-10-CM | POA: Diagnosis not present

## 2022-02-22 DIAGNOSIS — O0932 Supervision of pregnancy with insufficient antenatal care, second trimester: Secondary | ICD-10-CM | POA: Insufficient documentation

## 2022-02-22 LAB — DIFFERENTIAL
Abs Immature Granulocytes: 0.04 10*3/uL (ref 0.00–0.07)
Basophils Absolute: 0 10*3/uL (ref 0.0–0.1)
Basophils Relative: 0 %
Eosinophils Absolute: 0.3 10*3/uL (ref 0.0–0.5)
Eosinophils Relative: 4 %
Immature Granulocytes: 0 %
Lymphocytes Relative: 25 %
Lymphs Abs: 2.3 10*3/uL (ref 0.7–4.0)
Monocytes Absolute: 0.8 10*3/uL (ref 0.1–1.0)
Monocytes Relative: 8 %
Neutro Abs: 5.7 10*3/uL (ref 1.7–7.7)
Neutrophils Relative %: 63 %

## 2022-02-22 LAB — HIV ANTIBODY (ROUTINE TESTING W REFLEX): HIV Screen 4th Generation wRfx: NONREACTIVE

## 2022-02-22 LAB — TYPE AND SCREEN
ABO/RH(D): B POS
Antibody Screen: NEGATIVE

## 2022-02-22 LAB — CBC
HCT: 30.8 % — ABNORMAL LOW (ref 36.0–46.0)
Hemoglobin: 9.8 g/dL — ABNORMAL LOW (ref 12.0–15.0)
MCH: 27.5 pg (ref 26.0–34.0)
MCHC: 31.8 g/dL (ref 30.0–36.0)
MCV: 86.5 fL (ref 80.0–100.0)
Platelets: 575 10*3/uL — ABNORMAL HIGH (ref 150–400)
RBC: 3.56 MIL/uL — ABNORMAL LOW (ref 3.87–5.11)
RDW: 13 % (ref 11.5–15.5)
WBC: 9.1 10*3/uL (ref 4.0–10.5)
nRBC: 0 % (ref 0.0–0.2)

## 2022-02-22 LAB — HEPATITIS B SURFACE ANTIGEN: Hepatitis B Surface Ag: NONREACTIVE

## 2022-02-22 NOTE — MAU Note (Signed)
Sierra Daniels is a 26 y.o. at 13w5dhere in MAU reporting: states she is here for a check up. [facility]. No pain, bleeding, or LOF. +FM x2.   Onset of complaint: ongoing  Pain score: 0/10  Vitals:   02/22/22 1329  BP: 117/65  Pulse: 87  Resp: 16  Temp: 98.2 F (36.8 C)  SpO2: 100%     FDJT:TSVXA 140, Baby B 150  Lab orders placed from triage: none

## 2022-02-22 NOTE — MAU Provider Note (Signed)
Event Date/Time   First Provider Initiated Contact with Patient 02/22/22 1344      S Sierra Daniels is a 26 y.o. G2P1001 patient who presents to MAU today with complaints of no prenatal care. She has no other complaints.   O BP 117/65 (BP Location: Right Arm)   Pulse 87   Temp 98.2 F (36.8 C) (Oral)   Resp 16   Ht '5\' 8"'$  (1.727 m)   Wt 67.7 kg   LMP 08/19/2021 (Approximate)   SpO2 100% Comment: room air  BMI 22.70 kg/m  Physical Exam Vitals reviewed.  Constitutional:      Appearance: Normal appearance.  Cardiovascular:     Rate and Rhythm: Normal rate and regular rhythm.  Pulmonary:     Effort: Pulmonary effort is normal.     Breath sounds: Normal breath sounds.  Abdominal:     General: Abdomen is flat. There is no distension.     Palpations: Abdomen is soft.     Tenderness: There is no abdominal tenderness.  Skin:    Capillary Refill: Capillary refill takes less than 2 seconds.  Neurological:     General: No focal deficit present.     Mental Status: She is alert.  Psychiatric:        Mood and Affect: Mood normal.        Behavior: Behavior normal.        Thought Content: Thought content normal.        Judgment: Judgment normal.     A 1. [redacted] weeks gestation of pregnancy   2. Dichorionic diamniotic twin pregnancy in second trimester   3. Insufficient prenatal care in second trimester      P Discharge from MAU in stable condition Will get [place] with MFM - orders placed PNL drawn. F/u with [facility].  Truett Mainland, DO 02/22/2022 1:52 PM

## 2022-02-23 LAB — RPR
RPR Ser Ql: REACTIVE — AB
RPR Titer: 1:4 {titer}

## 2022-02-23 LAB — RUBELLA SCREEN: Rubella: 8.04 index (ref >0.99)

## 2022-02-24 LAB — T.PALLIDUM AB, TOTAL: T Pallidum Abs: REACTIVE — AB

## 2022-03-02 ENCOUNTER — Telehealth: Payer: Self-pay

## 2022-03-02 NOTE — Telephone Encounter (Signed)
Called pt to inform her of results and upcoming appointment. Called phone number on file and it rang several times then I heard the message "Sierra Daniels sorry your call cannot be completed as dialed." Will send MyChart message and attempt to call again at a later time.

## 2022-03-02 NOTE — Telephone Encounter (Signed)
-----   Message from Primitivo Gauze sent at 03/01/2022  3:40 PM EST ----- When the patient is called for these results can she be informed of her appointments as well please? ----- Message ----- From: Truett Mainland, DO Sent: 03/01/2022   8:37 AM EST To: [facility] Clinical Pool; [facility] Admin Pool  Patient tested positive for syphilis - last negative RPR was 3 years ago. Needs to be seen ASAP for penicillin injections weekly x 3 doses. Also needs to start prenatal care.

## 2022-03-03 NOTE — Telephone Encounter (Addendum)
Attempted to call patient to discuss lab results and to schedule treatment. Patient did not answer and received message that call cannot be completed at this time.   Called number listed to [name] at [phone]. LM to have patient, Sierra Daniels to call the office to speak to the nurse or to leave a message for the nurse asap.   Letter created to send to patient today. STD report faxed to [facility] Department.   Per chart review, patient has been scheduled for Bicillin Treatments and PNC.

## 2022-03-09 ENCOUNTER — Ambulatory Visit: Payer: Medicaid Other

## 2022-03-09 ENCOUNTER — Telehealth: Payer: Self-pay

## 2022-03-09 NOTE — Telephone Encounter (Signed)
Called pt to follow up on missed visit. Phone rings and then message heard saying "call cannot be completed at this time." MyChart message sent.

## 2022-03-16 ENCOUNTER — Ambulatory Visit: Payer: Self-pay

## 2022-03-16 ENCOUNTER — Encounter: Payer: Self-pay | Admitting: Family Medicine

## 2022-03-23 ENCOUNTER — Telehealth: Payer: Self-pay

## 2022-03-23 ENCOUNTER — Ambulatory Visit: Payer: Self-pay

## 2022-03-24 NOTE — Telephone Encounter (Signed)
Called pt to follow up on missed RN visit for treatment of Syphilis. Call cannot be completed.

## 2022-04-20 ENCOUNTER — Encounter: Payer: Self-pay | Admitting: *Deleted

## 2022-04-20 ENCOUNTER — Ambulatory Visit: Payer: Medicaid Other

## 2022-04-26 ENCOUNTER — Encounter: Payer: Self-pay | Admitting: Obstetrics and Gynecology

## 2022-04-26 ENCOUNTER — Other Ambulatory Visit: Payer: Self-pay | Admitting: Obstetrics and Gynecology

## 2022-04-26 ENCOUNTER — Other Ambulatory Visit: Payer: Self-pay

## 2022-04-26 ENCOUNTER — Other Ambulatory Visit (HOSPITAL_COMMUNITY)
Admission: RE | Admit: 2022-04-26 | Discharge: 2022-04-26 | Disposition: A | Discharge status: Home / Self Care | Payer: Medicaid Other | Source: Ambulatory Visit | Attending: Family Medicine | Admitting: Family Medicine

## 2022-04-26 ENCOUNTER — Ambulatory Visit (INDEPENDENT_AMBULATORY_CARE_PROVIDER_SITE_OTHER): Payer: Medicaid Other | Admitting: Obstetrics and Gynecology

## 2022-04-26 VITALS — BP 123/84 | HR 115 | Wt 164.9 lb

## 2022-04-26 DIAGNOSIS — O0993 Supervision of high risk pregnancy, unspecified, third trimester: Secondary | ICD-10-CM

## 2022-04-26 DIAGNOSIS — Z23 Encounter for immunization: Secondary | ICD-10-CM | POA: Diagnosis not present

## 2022-04-26 DIAGNOSIS — O98113 Syphilis complicating pregnancy, third trimester: Secondary | ICD-10-CM | POA: Diagnosis not present

## 2022-04-26 DIAGNOSIS — O99323 Drug use complicating pregnancy, third trimester: Secondary | ICD-10-CM

## 2022-04-26 DIAGNOSIS — M4124 Other idiopathic scoliosis, thoracic region: Secondary | ICD-10-CM

## 2022-04-26 DIAGNOSIS — O0990 Supervision of high risk pregnancy, unspecified, unspecified trimester: Secondary | ICD-10-CM | POA: Insufficient documentation

## 2022-04-26 DIAGNOSIS — O30043 Twin pregnancy, dichorionic/diamniotic, third trimester: Secondary | ICD-10-CM

## 2022-04-26 DIAGNOSIS — O0933 Supervision of pregnancy with insufficient antenatal care, third trimester: Secondary | ICD-10-CM

## 2022-04-26 DIAGNOSIS — Z363 Encounter for antenatal screening for malformations: Secondary | ICD-10-CM

## 2022-04-26 DIAGNOSIS — Z3A35 35 weeks gestation of pregnancy: Secondary | ICD-10-CM

## 2022-04-26 DIAGNOSIS — O99320 Drug use complicating pregnancy, unspecified trimester: Secondary | ICD-10-CM | POA: Insufficient documentation

## 2022-04-26 MED ORDER — PENICILLIN G BENZATHINE 1200000 UNIT/2ML IM SUSY
1.2000 10*6.[IU] | PREFILLED_SYRINGE | Freq: Once | INTRAMUSCULAR | Status: AC
  Administered 2022-04-26: 1.2 10*6.[IU] via INTRAMUSCULAR

## 2022-04-26 NOTE — Progress Notes (Signed)
INITIAL PRENATAL VISIT NOTE  Subjective:  Sierra Daniels is a 27 y.o. G2P1001 at 27w5dby unsure LMP  c/w 7 week ultrasound being seen today for her initial prenatal visit.She has had no formal prenatal care this pregnancy.  There has been no surveillance of the di/di twin gestation this pregnancy.  The patient admits to polysubstance abuse before and during the pregnancy including cocaine. She has an obstetric history significant for SVD x 1 at 39 weeks. She has a medical history significant for scoliosis, substance abuse and a history of gestational diabetes..  Patient reports occasional contractions.  Contractions: Irregular. Vag. Bleeding: Scant (pink d/c noted after braxton hicks).  Movement: Present. Denies leaking of fluid.    Past Medical History:  Diagnosis Date   Gestational diabetes    metformin   Herpes 2018   Scoliosis     Past Surgical History:  Procedure Laterality Date   NO PAST SURGERIES      OB History  Gravida Para Term Preterm AB Living  '2 1 1     1  '$ SAB IAB Ectopic Multiple Live Births        0 1    # Outcome Date GA Lbr Len/2nd Weight Sex Delivery Anes PTL Lv  2 Current           1 Term 01/27/18 343w1d 00:40 7 lb 10.4 oz (3.47 kg) F Vag-Spont EPI  LIV    Social History   Socioeconomic History   Marital status: Single    Spouse name: Not on file   Number of children: Not on file   Years of education: Not on file   Highest education level: Not on file  Occupational History   Not on file  Tobacco Use   Smoking status: Some Days    Types: Cigars    Last attempt to quit: 08/02/2017    Years since quitting: 4.7   Smokeless tobacco: Never  Vaping Use   Vaping Use: Never used  Substance and Sexual Activity   Alcohol use: Not Currently   Drug use: Not Currently    Types: Cocaine    Comment: last use in November 2023   Sexual activity: Not Currently    Birth control/protection: Injection  Other Topics Concern   Not on file  Social History  Narrative   Not on file   Social Determinants of Health   Financial Resource Strain: Low Risk  (01/19/2018)   Overall Financial Resource Strain (CARDIA)    Difficulty of Paying Living Expenses: Not hard at all  Food Insecurity: No Food Insecurity (01/19/2018)   Hunger Vital Sign    Worried About Running Out of Food in the Last Year: Never true    Ran Out of Food in the Last Year: Never true  Transportation Needs: Unknown (01/19/2018)   PRAPARE - TrHydrologistMedical): No    Lack of Transportation (Non-Medical): Not on file  Physical Activity: Not on file  Stress: No Stress Concern Present (01/19/2018)   [place]  Feeling of Stress : Only a little  Social Connections: Not on file    Family History  Problem Relation Age of Onset   Arthritis Mother    Diabetes Mother    Anxiety disorder Neg Hx    Alcohol abuse Neg Hx    ADD / ADHD Neg Hx    Asthma Neg Hx    Birth defects Neg  Hx    Cancer Neg Hx    COPD Neg Hx    Depression Neg Hx    Drug abuse Neg Hx    Early death Neg Hx    Hearing loss Neg Hx    Heart disease Neg Hx    Hypertension Neg Hx    Hyperlipidemia Neg Hx    Intellectual disability Neg Hx    Kidney disease Neg Hx    Miscarriages / Stillbirths Neg Hx    Learning disabilities Neg Hx    Obesity Neg Hx    Stroke Neg Hx    Vision loss Neg Hx    Varicose Veins Neg Hx      Current Outpatient Medications:    Prenatal Vit-Fe Fumarate-FA (PRENATAL MULTIVITAMIN) TABS tablet, Take 1 tablet by mouth daily at 12 noon., Disp: , Rfl:    metroNIDAZOLE (FLAGYL) 500 MG tablet, Take 1 tablet (500 mg total) by mouth 2 (two) times daily. (Patient not taking: Reported on 04/26/2022), Disp: 14 tablet, Rfl: 0  No Known Allergies  Review of Systems: Negative except for what is mentioned in HPI.  Objective:   Vitals:   04/26/22 1449  BP: 123/84  Pulse: (!) 115  Weight: 164  lb 14.4 oz (74.8 kg)    Fetal Status: Fetal Heart Rate (bpm): 146/133 Fundal Height: 40 cm Movement: Present     Physical Exam: BP 123/84   Pulse (!) 115   Wt 164 lb 14.4 oz (74.8 kg)   LMP 08/19/2021 (Approximate)   BMI 25.07 kg/m  CONSTITUTIONAL: Well-developed, well-nourished female in no acute distress.  NEUROLOGIC: Alert and oriented to person, place, and time. Normal reflexes, muscle tone coordination. No cranial nerve deficit noted. PSYCHIATRIC: Normal mood and affect. Normal behavior. Normal judgment and thought content. SKIN: Skin is warm and dry. No rash noted. Not diaphoretic. No erythema. No pallor. HENT:  Normocephalic, atraumatic, External right and left ear normal. Oropharynx is clear and moist EYES: Conjunctivae and EOM are normal.  NECK: Normal range of motion, supple, no masses CARDIOVASCULAR: Normal heart rate noted, regular rhythm RESPIRATORY: Effort and breath sounds normal, no problems with respiration noted BREASTS: deferred ABDOMEN: Soft, nontender, nondistended, gravid. GU: normal appearing external female genitalia, multiparous, dilated, normal appearing cervix, scant white discharge in vagina, no lesions noted Bimanual: 40 weeks sized uterus, no adnexal tenderness or palpable lesions noted MUSCULOSKELETAL: Normal range of motion. EXT:  No edema and no tenderness. 2+ distal pulses.   Assessment and Plan:  Pregnancy: G2P1001 at 100w5dby LMP  1. Supervision of high risk pregnancy, antepartum Continue routine prenatal care  - Culture, OB Urine [facility] Hemoglobin A1c - CBC/D/Plt+RPR+Rh+ABO+RubIgG... - Panorama Prenatal Test Full Panel - [place] Custom - [place]FM OB DETAIL +14 WK; Future - [place]FM OB DETAIL ADDL GEST +14 WK; Future - Cervicovaginal ancillary only( [place])  2. [redacted] weeks gestation of pregnancy   3. Other idiopathic scoliosis, thoracic region Would need to discuss with anesthesia regarding  epidural, but site is usually in lumbar spin  4. 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 Dichorionic diamniotic twin pregnancy in third trimester [place]  U/s is scheduled, but will try to move appointment up due drug use, multiple gestation and general lack of care as well as likely early delivery - [place]FM  OB DETAIL +14 WK; Future - [place] MFM OB DETAIL ADDL GEST +14 WK; Future  6. Limited prenatal care in third trimester   7. Drug use affecting pregnancy in third trimester Pending  - ToxASSURE Select 13 (MW), Urine [facility]) - Amb ref to [facility])   Preterm labor symptoms and general obstetric precautions including but not limited to vaginal bleeding, contractions, leaking of fluid and fetal movement were reviewed in detail with the patient.  Please refer to After Visit Summary for other counseling recommendations.   Return in about 1 week (around 05/03/2022) for [facility], in person.  Griffin Basil 04/26/2022 3:52 PM

## 2022-04-26 NOTE — Addendum Note (Signed)
Addended by: Forrestine Him A on: 04/26/2022 05:04 PM   Modules accepted: Orders

## 2022-04-26 NOTE — Patient Instructions (Addendum)
Ultrasound on Monday February 5th, 2024 at 1:45PM.   Please arrive at 1:30PM.  Location: [facility]                 [place], [place]  Phone: [phone]

## 2022-04-27 LAB — CBC/D/PLT+RPR+RH+ABO+RUBIGG...
Antibody Screen: NEGATIVE
Basophils Absolute: 0 10*3/uL (ref 0.0–0.2)
Basos: 0 %
EOS (ABSOLUTE): 0.2 10*3/uL (ref 0.0–0.4)
Eos: 2 %
HCV Ab: NONREACTIVE
HIV Screen 4th Generation wRfx: NONREACTIVE
Hematocrit: 30.8 % — ABNORMAL LOW (ref 34.0–46.6)
Hemoglobin: 9.8 g/dL — ABNORMAL LOW (ref 11.1–15.9)
Hepatitis B Surface Ag: NEGATIVE
Immature Grans (Abs): 0.1 10*3/uL (ref 0.0–0.1)
Immature Granulocytes: 1 %
Lymphocytes Absolute: 2 10*3/uL (ref 0.7–3.1)
Lymphs: 24 %
MCH: 24.9 pg — ABNORMAL LOW (ref 26.6–33.0)
MCHC: 31.8 g/dL (ref 31.5–35.7)
MCV: 78 fL — ABNORMAL LOW (ref 79–97)
Monocytes Absolute: 0.8 10*3/uL (ref 0.1–0.9)
Monocytes: 9 %
NRBC: 1 % — ABNORMAL HIGH (ref 0–0)
Neutrophils Absolute: 5.3 10*3/uL (ref 1.4–7.0)
Neutrophils: 64 %
Platelets: 308 10*3/uL (ref 150–450)
RBC: 3.93 x10E6/uL (ref 3.77–5.28)
RDW: 13.7 % (ref 11.7–15.4)
RPR Ser Ql: REACTIVE — AB
Rh Factor: POSITIVE
Rubella Antibodies, IGG: 8.58 index (ref >0.99)
WBC: 8.3 10*3/uL (ref 3.4–10.8)

## 2022-04-27 LAB — HEMOGLOBIN A1C
Est. average glucose Bld gHb Est-mCnc: 131 mg/dL
Hgb A1c MFr Bld: 6.2 % — ABNORMAL HIGH (ref 4.8–5.6)

## 2022-04-27 LAB — CERVICOVAGINAL ANCILLARY ONLY
Chlamydia: NEGATIVE
Comment: NEGATIVE
Comment: NEGATIVE
Comment: NORMAL
Neisseria Gonorrhea: NEGATIVE
Trichomonas: POSITIVE — AB

## 2022-04-27 LAB — RPR, QUANT+TP ABS (REFLEX)
Rapid Plasma Reagin, Quant: 1:2 {titer} — ABNORMAL HIGH
T Pallidum Abs: REACTIVE — AB

## 2022-04-27 LAB — HCV INTERPRETATION

## 2022-04-27 NOTE — Addendum Note (Signed)
Addended by: Donell Beers T on: 04/27/2022 08:14 AM   Modules accepted: Orders

## 2022-04-28 ENCOUNTER — Telehealth: Payer: Self-pay | Admitting: *Deleted

## 2022-04-28 ENCOUNTER — Telehealth: Payer: Self-pay | Admitting: Clinical

## 2022-04-28 LAB — TOXASSURE SELECT 13 (MW), URINE

## 2022-04-28 MED ORDER — METRONIDAZOLE 500 MG PO TABS: 500.0000 mg | ORAL_TABLET | Freq: Two times a day (BID) | ORAL | 0 refills | Status: DC

## 2022-04-28 NOTE — Telephone Encounter (Addendum)
-----  Message from Griffin Basil, MD sent at 04/27/2022 11:36 PM EST ----- Prenatal labs normal other then slightly elevated A1c and positive RPR, pt has known infx and is being retreated with penicillin, trichomonas noted on swab will offer treatment  1/24  1435  Called pt and informed her of test results as stated by Dr. Elgie Congo. She stated that she has previously been told by her PCP that she has diabetes but does not take medication. Pt was advised that Rx to treat Trichomonas infection will be sent to her pharmacy. Her partner will also need treatment and can schedule appointment @ [facility] in order to receive the medication. She should wait to have intercourse until one full week after both have been treated. Pt voiced understanding.

## 2022-04-28 NOTE — Telephone Encounter (Signed)
Attempt call regarding referral; Left HIPPA-compliant message to call back Roselyn Reef from [employer] for [employer] at [facility] at  [phone] [facility] office).

## 2022-04-29 ENCOUNTER — Ambulatory Visit (HOSPITAL_BASED_OUTPATIENT_CLINIC_OR_DEPARTMENT_OTHER): Payer: Medicaid Other | Admitting: Maternal & Fetal Medicine

## 2022-04-29 ENCOUNTER — Other Ambulatory Visit: Payer: Self-pay | Admitting: Obstetrics and Gynecology

## 2022-04-29 ENCOUNTER — Ambulatory Visit (HOSPITAL_BASED_OUTPATIENT_CLINIC_OR_DEPARTMENT_OTHER): Payer: Medicaid Other | Admitting: *Deleted

## 2022-04-29 ENCOUNTER — Encounter: Payer: Self-pay | Admitting: *Deleted

## 2022-04-29 ENCOUNTER — Ambulatory Visit (HOSPITAL_BASED_OUTPATIENT_CLINIC_OR_DEPARTMENT_OTHER): Payer: Medicaid Other

## 2022-04-29 ENCOUNTER — Ambulatory Visit: Payer: Medicaid Other | Attending: Obstetrics and Gynecology | Admitting: *Deleted

## 2022-04-29 VITALS — BP 133/75 | HR 115

## 2022-04-29 DIAGNOSIS — O30043 Twin pregnancy, dichorionic/diamniotic, third trimester: Secondary | ICD-10-CM

## 2022-04-29 DIAGNOSIS — O0990 Supervision of high risk pregnancy, unspecified, unspecified trimester: Secondary | ICD-10-CM

## 2022-04-29 DIAGNOSIS — F1910 Other psychoactive substance abuse, uncomplicated: Secondary | ICD-10-CM | POA: Insufficient documentation

## 2022-04-29 DIAGNOSIS — O99323 Drug use complicating pregnancy, third trimester: Secondary | ICD-10-CM

## 2022-04-29 DIAGNOSIS — O24419 Gestational diabetes mellitus in pregnancy, unspecified control: Secondary | ICD-10-CM | POA: Insufficient documentation

## 2022-04-29 DIAGNOSIS — O98113 Syphilis complicating pregnancy, third trimester: Secondary | ICD-10-CM

## 2022-04-29 DIAGNOSIS — O365931 Maternal care for other known or suspected poor fetal growth, third trimester, fetus 1: Secondary | ICD-10-CM | POA: Insufficient documentation

## 2022-04-29 DIAGNOSIS — O09293 Supervision of pregnancy with other poor reproductive or obstetric history, third trimester: Secondary | ICD-10-CM | POA: Insufficient documentation

## 2022-04-29 DIAGNOSIS — Z3A36 36 weeks gestation of pregnancy: Secondary | ICD-10-CM

## 2022-04-29 DIAGNOSIS — Z363 Encounter for antenatal screening for malformations: Secondary | ICD-10-CM | POA: Diagnosis not present

## 2022-04-29 DIAGNOSIS — O0933 Supervision of pregnancy with insufficient antenatal care, third trimester: Secondary | ICD-10-CM

## 2022-04-29 DIAGNOSIS — Z3A37 37 weeks gestation of pregnancy: Secondary | ICD-10-CM | POA: Diagnosis not present

## 2022-04-29 DIAGNOSIS — O365932 Maternal care for other known or suspected poor fetal growth, third trimester, fetus 2: Secondary | ICD-10-CM

## 2022-04-29 DIAGNOSIS — Z364 Encounter for antenatal screening for fetal growth retardation: Secondary | ICD-10-CM

## 2022-04-29 DIAGNOSIS — O0993 Supervision of high risk pregnancy, unspecified, third trimester: Secondary | ICD-10-CM | POA: Diagnosis not present

## 2022-04-29 DIAGNOSIS — A539 Syphilis, unspecified: Secondary | ICD-10-CM

## 2022-04-29 DIAGNOSIS — F1490 Cocaine use, unspecified, uncomplicated: Secondary | ICD-10-CM | POA: Diagnosis not present

## 2022-04-29 DIAGNOSIS — Z8632 Personal history of gestational diabetes: Secondary | ICD-10-CM

## 2022-04-29 LAB — CULTURE, OB URINE

## 2022-04-29 LAB — URINE CULTURE, OB REFLEX

## 2022-04-29 NOTE — Progress Notes (Signed)
Diamniotic Dichorionic pregnancy at an advanced gestational age of [redacted] weeks.  Sierra Daniels is a G2P1 who is seen at 36 weeks by LMP and early ultrasound at 7 weeks 4 days. She is seen at the request of Dr. Lynnda Shields.  Her pregnancy is complicated by polysubstance abuse, poor prenatal care and twin pregnancy.  Sierra Daniels  is a G3P2 who is here for a detailed examination for DiDi twin pregnancy. Normal anatomy with good amniotic fluid and fetal movement was observed in Twin A and B. (Cephalic/Transverse) However, the is FGR noted in both twins. Severe in twin A 1.8% and 5% in Twin B.  Suboptimal views of the fetal anatomy were obtained secondary to fetal position and advance gestational age.  Twin discordance of 8%.  There is normal UA Dopplers without evidence of AEDF or REDF.  A BPP of 10/10 is observed in both twins.  I reviewed the normal nature of today's ultrasound. Sierra Daniels .  She had a low risk NIPS and negative horizon. However, she was recently diagnosed with GDM but has not begun checking her blood sugars yet. She has her diabetic education visit today.  We reviewed the sonographic findings and limitations of ultrasound. The potential risks associated with a twin gestation were discussed.  This discussion included a review of the increased risk of miscarriages, anomalies, preterm labor, and/or delivery, malpresentation, delivery via cesarean section, gestational diabetes, and/or preeclampsia.  With regards to fetal risks, there is an increased risk for fetal growth restriction of one or both twins, preterm labor, and associated morbidity, and intrauterine fetal demise.    I discussed today's visit with a diagnosis of FGR. I explained that the etiology includes placental insufficiency, chronic disease, infection, aneuploidy and other genetic syndromes. She does not have genetic testing. She has a risk factor for polysubstance abuse.   At this time I explained the diagnosis, evaluation  and management to antental surveillance and delivery between 36-37 weeks.   Her blood pressure was 133/75 mmHg.   Following counseling, all questions were addressed.    I spent 30 minutes with > 50% in face to face consultation.  I discussed this plan with Dr. Lynnda Shields and sent an [facility] message with the patients information.

## 2022-04-29 NOTE — Procedures (Signed)
Felipa Langlois September 29, 1995 [redacted]w[redacted]d  Fetus B Non-Stress Test Interpretation for 04/29/22  Indication: IUGR  Fetal Heart Rate Fetus B Mode: External Baseline Rate (B): 130 BPM Variability: Moderate Accelerations: 15 x 15 Decelerations: None  Uterine Activity Mode: Palpation, Toco Contraction Frequency (min): q 9 min Contraction Duration (sec): 50-80 Contraction Quality: Mild Resting Tone Palpated: Relaxed Resting Time: Adequate  Interpretation (Baby B - Fetal Testing) Nonstress Test Interpretation (Baby B): Reactive Overall Impression (Baby B): Reassuring for gestational age Comments (Baby B): Dr. BGertie Exonreviewed tracing  KLatish Toutant110/[id number] A Non-Stress Test Interpretation for 04/29/22  Indication: IUGR  Fetal Heart Rate A Mode: External Baseline Rate (A): 140 bpm Variability: Moderate Accelerations: 15 x 15 Decelerations: None Multiple birth?: Yes  Uterine Activity Mode: Palpation, Toco Contraction Frequency (min): q 9 min Contraction Duration (sec): 50-80 Contraction Quality: Mild Resting Tone Palpated: Relaxed Resting Time: Adequate  Interpretation (Fetal Testing) Nonstress Test Interpretation: Reactive Overall Impression: Reassuring for gestational age Comments: Dr. BoGertie Exoneviewed tracing

## 2022-04-30 ENCOUNTER — Telehealth: Payer: Self-pay | Admitting: Clinical

## 2022-04-30 NOTE — Telephone Encounter (Signed)
Attempt call regarding referral; Left HIPPA-compliant message to call back Roselyn Reef from [employer] for [employer] at [facility] at  [phone] [facility] office).

## 2022-05-01 LAB — CULTURE, BETA STREP (GROUP B ONLY): Strep Gp B Culture: NEGATIVE

## 2022-05-03 ENCOUNTER — Encounter: Payer: Self-pay | Admitting: Obstetrics and Gynecology

## 2022-05-04 ENCOUNTER — Telehealth (HOSPITAL_COMMUNITY): Payer: Self-pay | Admitting: *Deleted

## 2022-05-04 ENCOUNTER — Ambulatory Visit (INDEPENDENT_AMBULATORY_CARE_PROVIDER_SITE_OTHER): Payer: Medicaid Other | Admitting: Family Medicine

## 2022-05-04 ENCOUNTER — Telehealth: Payer: Self-pay

## 2022-05-04 ENCOUNTER — Other Ambulatory Visit: Payer: Self-pay

## 2022-05-04 ENCOUNTER — Encounter (HOSPITAL_COMMUNITY): Payer: Self-pay

## 2022-05-04 VITALS — BP 124/87 | HR 101 | Wt 178.6 lb

## 2022-05-04 DIAGNOSIS — O98113 Syphilis complicating pregnancy, third trimester: Secondary | ICD-10-CM

## 2022-05-04 DIAGNOSIS — O99323 Drug use complicating pregnancy, third trimester: Secondary | ICD-10-CM

## 2022-05-04 DIAGNOSIS — O30043 Twin pregnancy, dichorionic/diamniotic, third trimester: Secondary | ICD-10-CM | POA: Diagnosis not present

## 2022-05-04 DIAGNOSIS — O365930 Maternal care for other known or suspected poor fetal growth, third trimester, not applicable or unspecified: Secondary | ICD-10-CM

## 2022-05-04 DIAGNOSIS — O0990 Supervision of high risk pregnancy, unspecified, unspecified trimester: Secondary | ICD-10-CM

## 2022-05-04 DIAGNOSIS — Z3A36 36 weeks gestation of pregnancy: Secondary | ICD-10-CM | POA: Diagnosis not present

## 2022-05-04 DIAGNOSIS — O0933 Supervision of pregnancy with insufficient antenatal care, third trimester: Secondary | ICD-10-CM

## 2022-05-04 DIAGNOSIS — O0993 Supervision of high risk pregnancy, unspecified, third trimester: Secondary | ICD-10-CM | POA: Diagnosis not present

## 2022-05-04 DIAGNOSIS — O24419 Gestational diabetes mellitus in pregnancy, unspecified control: Secondary | ICD-10-CM

## 2022-05-04 LAB — HORIZON CUSTOM: REPORT SUMMARY: NEGATIVE

## 2022-05-04 MED ORDER — PENICILLIN G BENZATHINE 1200000 UNIT/2ML IM SUSY
1.2000 10*6.[IU] | PREFILLED_SYRINGE | Freq: Once | INTRAMUSCULAR | Status: AC
  Administered 2022-05-04: 1.2 10*6.[IU] via INTRAMUSCULAR

## 2022-05-04 NOTE — Telephone Encounter (Signed)
Called patient, surgery date, time, location and preop instructions given, patient expressed understanding. 

## 2022-05-04 NOTE — Telephone Encounter (Signed)
Left voicemail for pt and mychart message with surgery instructions.

## 2022-05-04 NOTE — Progress Notes (Signed)
   Subjective:  Sierra Daniels is a 27 y.o. G2P1001 at 57w6dbeing seen today for ongoing prenatal care.  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  Patient reports no complaints.  Contractions: Irregular. Vag. Bleeding: None.  Movement: Present. Denies leaking of fluid.   The following portions of the patient's history were reviewed and updated as appropriate: allergies, current medications, past family history, past medical history, past social history, past surgical history and problem list. Problem list updated.  Objective:   Vitals:   05/04/22 1334 05/04/22 1443  BP: (!) 141/83 124/87  Pulse: (!) 101   Weight: 178 lb 9.6 oz (81 kg)     Fetal Status: Fetal Heart Rate (bpm): 145/136   Movement: Present     General:  Alert, oriented and cooperative. Patient is in no acute distress.  Skin: Skin is warm and dry. No rash noted.   Cardiovascular: Normal heart rate noted  Respiratory: Normal respiratory effort, no problems with respiration noted  Abdomen: Soft, gravid, appropriate for gestational age. Pain/Pressure: Present     Pelvic: Vag. Bleeding: None     Cervical exam deferred        Extremities: Normal range of motion.     Mental Status: Normal mood and affect. Normal behavior. Normal judgment and thought content.   Urinalysis:      Assessment and Plan:  Pregnancy: G2P1001 at 33w6d1. Supervision of high risk pregnancy, antepartum BP initially elevated, repeat normal FHR normal  2. Dichorionic diamniotic twin pregnancy in third trimester MFM [place]one on 04/29/2022 showed: Twin A cephalic, severe IUGR  1.0.0%win B transverse, IUGR 5% BPP 10/10, normal cord dopplers Scheduled for cesarean tomorrow, but she reports she very much desires vaginal delivery Counseled on breech extraction, small risk of fetal head entrapment, possibility of vaginal>cesarean delivery, etc. She will continue to consider, plans to go to hospital tomorrow  3. 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 Limited prenatal care in third trimester New OB visit done last week  6. Gestational diabetes mellitus (GDM) in third trimester, gestational diabetes method of control unspecified New OB A1c 6.2% Not adhering to diet Scheduled for delivery tomorrow  Preterm labor symptoms and general obstetric precautions including but not limited to vaginal bleeding, contractions, leaking of fluid and fetal movement were reviewed in detail with the patient. Please refer to After Visit Summary for other counseling recommendations.  No follow-ups on file.   Sierra FlockMD

## 2022-05-04 NOTE — Patient Instructions (Signed)
Sierra Daniels  05/04/2022   Your procedure is scheduled on:  05/05/2022  Arrive at 1100 at Entrance C on [address] at [facility]  and [employer]. You are invited to use the FREE valet parking or use the Visitor's parking deck.  Pick up the phone at the desk and dial [phone].  Call this number if you have problems the morning of surgery: [phone]  Remember:   Do not eat food:(After Midnight) Desps de medianoche.  Do not drink clear liquids: (After Midnight) Desps de medianoche.  Take these medicines the morning of surgery with A SIP OF WATER:  none   Do not wear jewelry, make-up or nail polish.  Do not wear lotions, powders, or perfumes. Do not wear deodorant.  Do not shave 48 hours prior to surgery.  Do not bring valuables to the hospital.  [facility] is not   responsible for any belongings or valuables brought to the hospital.  Contacts, dentures or bridgework may not be worn into surgery.  Leave suitcase in the car. After surgery it may be brought to your room.  For patients admitted to the hospital, checkout time is 11:00 AM the day of              discharge.      Please read over the following fact sheets that you were given:     Preparing for Surgery

## 2022-05-05 ENCOUNTER — Encounter: Payer: Self-pay | Admitting: *Deleted

## 2022-05-05 ENCOUNTER — Encounter (HOSPITAL_COMMUNITY): Payer: Self-pay | Admitting: Family Medicine

## 2022-05-05 ENCOUNTER — Inpatient Hospital Stay (HOSPITAL_COMMUNITY)
Admission: RE | Admit: 2022-05-05 | Discharge: 2022-05-08 | DRG: 785 | Disposition: A | Discharge status: Home / Self Care | Payer: Medicaid Other | Attending: Obstetrics and Gynecology | Admitting: Obstetrics and Gynecology

## 2022-05-05 ENCOUNTER — Inpatient Hospital Stay (HOSPITAL_COMMUNITY): Payer: Medicaid Other | Admitting: Anesthesiology

## 2022-05-05 ENCOUNTER — Other Ambulatory Visit: Payer: Self-pay | Admitting: Family Medicine

## 2022-05-05 ENCOUNTER — Other Ambulatory Visit: Payer: Self-pay

## 2022-05-05 ENCOUNTER — Encounter (HOSPITAL_COMMUNITY)
Admission: RE | Disposition: A | Discharge status: Home / Self Care | Payer: Self-pay | Source: Home / Self Care | Attending: Obstetrics and Gynecology

## 2022-05-05 DIAGNOSIS — Z3A37 37 weeks gestation of pregnancy: Secondary | ICD-10-CM

## 2022-05-05 DIAGNOSIS — O365931 Maternal care for other known or suspected poor fetal growth, third trimester, fetus 1: Principal | ICD-10-CM | POA: Diagnosis present

## 2022-05-05 DIAGNOSIS — Z302 Encounter for sterilization: Secondary | ICD-10-CM

## 2022-05-05 DIAGNOSIS — O321XX0 Maternal care for breech presentation, not applicable or unspecified: Secondary | ICD-10-CM | POA: Diagnosis not present

## 2022-05-05 DIAGNOSIS — O24429 Gestational diabetes mellitus in childbirth, unspecified control: Secondary | ICD-10-CM | POA: Diagnosis present

## 2022-05-05 DIAGNOSIS — R600 Localized edema: Secondary | ICD-10-CM | POA: Diagnosis not present

## 2022-05-05 DIAGNOSIS — R4589 Other symptoms and signs involving emotional state: Secondary | ICD-10-CM | POA: Diagnosis present

## 2022-05-05 DIAGNOSIS — M79601 Pain in right arm: Secondary | ICD-10-CM | POA: Diagnosis present

## 2022-05-05 DIAGNOSIS — O322XX0 Maternal care for transverse and oblique lie, not applicable or unspecified: Secondary | ICD-10-CM

## 2022-05-05 DIAGNOSIS — O30043 Twin pregnancy, dichorionic/diamniotic, third trimester: Secondary | ICD-10-CM

## 2022-05-05 DIAGNOSIS — O99320 Drug use complicating pregnancy, unspecified trimester: Secondary | ICD-10-CM | POA: Diagnosis present

## 2022-05-05 DIAGNOSIS — M419 Scoliosis, unspecified: Secondary | ICD-10-CM | POA: Diagnosis present

## 2022-05-05 DIAGNOSIS — O365930 Maternal care for other known or suspected poor fetal growth, third trimester, not applicable or unspecified: Secondary | ICD-10-CM | POA: Diagnosis present

## 2022-05-05 DIAGNOSIS — O365932 Maternal care for other known or suspected poor fetal growth, third trimester, fetus 2: Secondary | ICD-10-CM | POA: Diagnosis present

## 2022-05-05 DIAGNOSIS — O99324 Drug use complicating childbirth: Secondary | ICD-10-CM | POA: Diagnosis not present

## 2022-05-05 DIAGNOSIS — O98113 Syphilis complicating pregnancy, third trimester: Secondary | ICD-10-CM | POA: Diagnosis present

## 2022-05-05 DIAGNOSIS — O0990 Supervision of high risk pregnancy, unspecified, unspecified trimester: Secondary | ICD-10-CM

## 2022-05-05 DIAGNOSIS — O9812 Syphilis complicating childbirth: Secondary | ICD-10-CM | POA: Diagnosis not present

## 2022-05-05 DIAGNOSIS — Z98891 History of uterine scar from previous surgery: Principal | ICD-10-CM

## 2022-05-05 DIAGNOSIS — Z79899 Other long term (current) drug therapy: Secondary | ICD-10-CM | POA: Diagnosis not present

## 2022-05-05 DIAGNOSIS — O24420 Gestational diabetes mellitus in childbirth, diet controlled: Secondary | ICD-10-CM | POA: Diagnosis not present

## 2022-05-05 DIAGNOSIS — O0933 Supervision of pregnancy with insufficient antenatal care, third trimester: Secondary | ICD-10-CM

## 2022-05-05 DIAGNOSIS — O9902 Anemia complicating childbirth: Secondary | ICD-10-CM | POA: Diagnosis present

## 2022-05-05 DIAGNOSIS — O24419 Gestational diabetes mellitus in pregnancy, unspecified control: Secondary | ICD-10-CM | POA: Diagnosis present

## 2022-05-05 LAB — CBC
HCT: 30.7 % — ABNORMAL LOW (ref 36.0–46.0)
Hemoglobin: 9.2 g/dL — ABNORMAL LOW (ref 12.0–15.0)
MCH: 23.8 pg — ABNORMAL LOW (ref 26.0–34.0)
MCHC: 30 g/dL (ref 30.0–36.0)
MCV: 79.3 fL — ABNORMAL LOW (ref 80.0–100.0)
Platelets: 295 10*3/uL (ref 150–400)
RBC: 3.87 MIL/uL (ref 3.87–5.11)
RDW: 14.9 % (ref 11.5–15.5)
WBC: 9.2 10*3/uL (ref 4.0–10.5)
nRBC: 0.5 % — ABNORMAL HIGH (ref 0.0–0.2)

## 2022-05-05 LAB — TYPE AND SCREEN
ABO/RH(D): B POS
Antibody Screen: NEGATIVE

## 2022-05-05 LAB — GLUCOSE, CAPILLARY
Glucose-Capillary: 66 mg/dL — ABNORMAL LOW (ref 70–99)
Glucose-Capillary: 97 mg/dL (ref 70–99)
Glucose-Capillary: 68 mg/dL — ABNORMAL LOW (ref 70–99)
Glucose-Capillary: 80 mg/dL (ref 70–99)

## 2022-05-05 SURGERY — Surgical Case
Anesthesia: Spinal

## 2022-05-05 MED ORDER — ACETAMINOPHEN 10 MG/ML IV SOLN
INTRAVENOUS | Status: DC | PRN
  Administered 2022-05-05: 1000 mg via INTRAVENOUS

## 2022-05-05 MED ORDER — ENOXAPARIN SODIUM 40 MG/0.4ML IJ SOSY
40.0000 mg | PREFILLED_SYRINGE | INTRAMUSCULAR | Status: DC
  Administered 2022-05-06 – 2022-05-07 (×2): 40 mg via SUBCUTANEOUS
  Filled 2022-05-05 (×2): qty 0.4

## 2022-05-05 MED ORDER — TETANUS-DIPHTH-ACELL PERTUSSIS 5-2.5-18.5 LF-MCG/0.5 IM SUSY: 0.5000 mL | PREFILLED_SYRINGE | Freq: Once | INTRAMUSCULAR | Status: DC

## 2022-05-05 MED ORDER — ONDANSETRON HCL 4 MG/2ML IJ SOLN: 4.0000 mg | Freq: Three times a day (TID) | INTRAMUSCULAR | Status: DC | PRN

## 2022-05-05 MED ORDER — DIPHENHYDRAMINE HCL 50 MG/ML IJ SOLN
12.5000 mg | INTRAMUSCULAR | Status: DC | PRN
  Administered 2022-05-05: 12.5 mg via INTRAVENOUS
  Filled 2022-05-05: qty 1

## 2022-05-05 MED ORDER — DIBUCAINE (PERIANAL) 1 % EX OINT: 1.0000 | TOPICAL_OINTMENT | CUTANEOUS | Status: DC | PRN

## 2022-05-05 MED ORDER — FENTANYL CITRATE (PF) 100 MCG/2ML IJ SOLN
INTRAMUSCULAR | Status: AC
  Filled 2022-05-05: qty 2

## 2022-05-05 MED ORDER — OXYTOCIN-SODIUM CHLORIDE 30-0.9 UT/500ML-% IV SOLN: 2.5000 [IU]/h | INTRAVENOUS | Status: AC

## 2022-05-05 MED ORDER — DEXTROSE 50 % IV SOLN
INTRAVENOUS | Status: AC
  Filled 2022-05-05: qty 50

## 2022-05-05 MED ORDER — ACETAMINOPHEN 500 MG PO TABS: 1000.0000 mg | ORAL_TABLET | Freq: Four times a day (QID) | ORAL | Status: DC

## 2022-05-05 MED ORDER — POVIDONE-IODINE 10 % EX SWAB
2.0000 | Freq: Once | CUTANEOUS | Status: AC
  Administered 2022-05-05: 2 via TOPICAL

## 2022-05-05 MED ORDER — SIMETHICONE 80 MG PO CHEW
80.0000 mg | CHEWABLE_TABLET | Freq: Three times a day (TID) | ORAL | Status: DC
  Administered 2022-05-07: 80 mg via ORAL
  Filled 2022-05-05 (×5): qty 1

## 2022-05-05 MED ORDER — OXYTOCIN-SODIUM CHLORIDE 30-0.9 UT/500ML-% IV SOLN
INTRAVENOUS | Status: DC | PRN
  Administered 2022-05-05: 300 mL via INTRAVENOUS

## 2022-05-05 MED ORDER — MENTHOL 3 MG MT LOZG: 1.0000 | LOZENGE | OROMUCOSAL | Status: DC | PRN

## 2022-05-05 MED ORDER — DEXAMETHASONE SODIUM PHOSPHATE 4 MG/ML IJ SOLN
INTRAMUSCULAR | Status: DC | PRN
  Administered 2022-05-05: 4 mg via INTRAVENOUS

## 2022-05-05 MED ORDER — OXYCODONE HCL 5 MG PO TABS: 5.0000 mg | ORAL_TABLET | ORAL | Status: DC | PRN

## 2022-05-05 MED ORDER — PRENATAL MULTIVITAMIN CH
1.0000 | ORAL_TABLET | Freq: Every day | ORAL | Status: DC
  Administered 2022-05-06 – 2022-05-07 (×2): 1 via ORAL
  Filled 2022-05-05 (×2): qty 1

## 2022-05-05 MED ORDER — DROPERIDOL 2.5 MG/ML IJ SOLN: 0.6250 mg | Freq: Once | INTRAMUSCULAR | Status: DC | PRN

## 2022-05-05 MED ORDER — LACTATED RINGERS IV SOLN: INTRAVENOUS | Status: DC

## 2022-05-05 MED ORDER — FENTANYL CITRATE (PF) 100 MCG/2ML IJ SOLN: 25.0000 ug | INTRAMUSCULAR | Status: DC | PRN

## 2022-05-05 MED ORDER — DIPHENHYDRAMINE HCL 25 MG PO CAPS: 25.0000 mg | ORAL_CAPSULE | ORAL | Status: DC | PRN

## 2022-05-05 MED ORDER — ACETAMINOPHEN 10 MG/ML IV SOLN
INTRAVENOUS | Status: AC
  Filled 2022-05-05: qty 200

## 2022-05-05 MED ORDER — KETOROLAC TROMETHAMINE 30 MG/ML IJ SOLN
30.0000 mg | Freq: Once | INTRAMUSCULAR | Status: AC
  Administered 2022-05-05: 30 mg via INTRAVENOUS

## 2022-05-05 MED ORDER — ONDANSETRON HCL 4 MG/2ML IJ SOLN
INTRAMUSCULAR | Status: DC | PRN
  Administered 2022-05-05: 4 mg via INTRAVENOUS

## 2022-05-05 MED ORDER — KETOROLAC TROMETHAMINE 30 MG/ML IJ SOLN
30.0000 mg | Freq: Four times a day (QID) | INTRAMUSCULAR | Status: AC
  Administered 2022-05-06 (×3): 30 mg via INTRAVENOUS
  Filled 2022-05-05 (×3): qty 1

## 2022-05-05 MED ORDER — ACETAMINOPHEN 500 MG PO TABS: 1000.0000 mg | ORAL_TABLET | Freq: Once | ORAL | Status: DC

## 2022-05-05 MED ORDER — PHENYLEPHRINE HCL-NACL 20-0.9 MG/250ML-% IV SOLN
INTRAVENOUS | Status: DC | PRN
  Administered 2022-05-05: 60 ug/min via INTRAVENOUS

## 2022-05-05 MED ORDER — NALOXONE HCL 4 MG/10ML IJ SOLN: 1.0000 ug/kg/h | INTRAVENOUS | Status: DC | PRN

## 2022-05-05 MED ORDER — DEXTROSE 50 % IV SOLN
12.5000 g | INTRAVENOUS | Status: AC
  Administered 2022-05-05: 12.5 g via INTRAVENOUS

## 2022-05-05 MED ORDER — WITCH HAZEL-GLYCERIN EX PADS: 1.0000 | MEDICATED_PAD | CUTANEOUS | Status: DC | PRN

## 2022-05-05 MED ORDER — SIMETHICONE 80 MG PO CHEW: 80.0000 mg | CHEWABLE_TABLET | ORAL | Status: DC | PRN

## 2022-05-05 MED ORDER — IBUPROFEN 600 MG PO TABS
600.0000 mg | ORAL_TABLET | Freq: Four times a day (QID) | ORAL | Status: DC
  Administered 2022-05-06 – 2022-05-08 (×5): 600 mg via ORAL
  Filled 2022-05-05 (×6): qty 1

## 2022-05-05 MED ORDER — DIPHENHYDRAMINE HCL 25 MG PO CAPS: 25.0000 mg | ORAL_CAPSULE | Freq: Four times a day (QID) | ORAL | Status: DC | PRN

## 2022-05-05 MED ORDER — NALOXONE HCL 0.4 MG/ML IJ SOLN: 0.4000 mg | INTRAMUSCULAR | Status: DC | PRN

## 2022-05-05 MED ORDER — CEFAZOLIN SODIUM-DEXTROSE 2-4 GM/100ML-% IV SOLN
2.0000 g | INTRAVENOUS | Status: AC
  Administered 2022-05-05: 2 g via INTRAVENOUS

## 2022-05-05 MED ORDER — SENNOSIDES-DOCUSATE SODIUM 8.6-50 MG PO TABS
2.0000 | ORAL_TABLET | Freq: Every day | ORAL | Status: DC
  Administered 2022-05-07: 2 via ORAL
  Filled 2022-05-05 (×2): qty 2

## 2022-05-05 MED ORDER — MORPHINE SULFATE (PF) 0.5 MG/ML IJ SOLN
INTRAMUSCULAR | Status: DC | PRN
  Administered 2022-05-05: .15 mg via INTRATHECAL

## 2022-05-05 MED ORDER — KETOROLAC TROMETHAMINE 30 MG/ML IJ SOLN: 30.0000 mg | Freq: Four times a day (QID) | INTRAMUSCULAR | Status: AC | PRN

## 2022-05-05 MED ORDER — MAGNESIUM HYDROXIDE 400 MG/5ML PO SUSP: 30.0000 mL | ORAL | Status: DC | PRN

## 2022-05-05 MED ORDER — KETOROLAC TROMETHAMINE 30 MG/ML IJ SOLN
INTRAMUSCULAR | Status: AC
  Filled 2022-05-05: qty 1

## 2022-05-05 MED ORDER — SCOPOLAMINE 1 MG/3DAYS TD PT72
MEDICATED_PATCH | TRANSDERMAL | Status: DC | PRN
  Administered 2022-05-05: 1 via TRANSDERMAL

## 2022-05-05 MED ORDER — PHENYLEPHRINE HCL (PRESSORS) 10 MG/ML IV SOLN
INTRAVENOUS | Status: DC | PRN
  Administered 2022-05-05: 160 ug via INTRAVENOUS
  Administered 2022-05-05: 80 ug via INTRAVENOUS

## 2022-05-05 MED ORDER — TRANEXAMIC ACID-NACL 1000-0.7 MG/100ML-% IV SOLN
1000.0000 mg | INTRAVENOUS | Status: AC
  Administered 2022-05-05: 1000 mg via INTRAVENOUS

## 2022-05-05 MED ORDER — ACETAMINOPHEN 160 MG/5ML PO SOLN: 1000.0000 mg | Freq: Once | ORAL | Status: DC

## 2022-05-05 MED ORDER — COCONUT OIL OIL: 1.0000 | TOPICAL_OIL | Status: DC | PRN

## 2022-05-05 MED ORDER — FENTANYL CITRATE (PF) 100 MCG/2ML IJ SOLN
INTRAMUSCULAR | Status: DC | PRN
  Administered 2022-05-05: 15 ug via INTRATHECAL

## 2022-05-05 MED ORDER — BUPIVACAINE IN DEXTROSE 0.75-8.25 % IT SOLN
INTRATHECAL | Status: DC | PRN
  Administered 2022-05-05: 1.6 mL via INTRATHECAL

## 2022-05-05 MED ORDER — SODIUM CHLORIDE 0.9% FLUSH: 3.0000 mL | INTRAVENOUS | Status: DC | PRN

## 2022-05-05 MED ORDER — ACETAMINOPHEN 500 MG PO TABS
1000.0000 mg | ORAL_TABLET | Freq: Four times a day (QID) | ORAL | Status: DC
  Administered 2022-05-05 – 2022-05-08 (×8): 1000 mg via ORAL
  Filled 2022-05-05 (×9): qty 2

## 2022-05-05 MED ORDER — MORPHINE SULFATE (PF) 0.5 MG/ML IJ SOLN
INTRAMUSCULAR | Status: AC
  Filled 2022-05-05: qty 10

## 2022-05-05 MED ORDER — ZOLPIDEM TARTRATE 5 MG PO TABS: 5.0000 mg | ORAL_TABLET | Freq: Every evening | ORAL | Status: DC | PRN

## 2022-05-05 MED ORDER — GABAPENTIN 100 MG PO CAPS
200.0000 mg | ORAL_CAPSULE | Freq: Every day | ORAL | Status: DC
  Administered 2022-05-05: 200 mg via ORAL
  Filled 2022-05-05: qty 2

## 2022-05-05 SURGICAL SUPPLY — 34 items
ADH SKN CLS APL DERMABOND .7 (GAUZE/BANDAGES/DRESSINGS) ×2
ADH SKN CLS LQ APL DERMABOND (GAUZE/BANDAGES/DRESSINGS) ×1
APL PRP STRL LF DISP 70% ISPRP (MISCELLANEOUS) ×2
CHLORAPREP W/TINT 26 (MISCELLANEOUS) ×2 IMPLANT
CLAMP UMBILICAL CORD (MISCELLANEOUS) ×1 IMPLANT
CLOTH BEACON ORANGE TIMEOUT ST (SAFETY) ×1 IMPLANT
DERMABOND ADVANCED .7 DNX12 (GAUZE/BANDAGES/DRESSINGS) ×2 IMPLANT
DERMABOND ADVANCED .7 DNX6 (GAUZE/BANDAGES/DRESSINGS) IMPLANT
DRSG OPSITE POSTOP 4X10 (GAUZE/BANDAGES/DRESSINGS) ×1 IMPLANT
DRSG OPSITE POSTOP 4X8 (GAUZE/BANDAGES/DRESSINGS) IMPLANT
ELECT REM PT RETURN 9FT ADLT (ELECTROSURGICAL) ×1
ELECTRODE REM PT RTRN 9FT ADLT (ELECTROSURGICAL) ×1 IMPLANT
EXTRACTOR VACUUM M CUP 4 TUBE (SUCTIONS) IMPLANT
GLOVE BIOGEL PI IND STRL 7.0 (GLOVE) ×2 IMPLANT
GLOVE BIOGEL PI IND STRL 7.5 (GLOVE) ×2 IMPLANT
GLOVE ECLIPSE 7.5 STRL STRAW (GLOVE) ×1 IMPLANT
GOWN STRL REUS W/TWL LRG LVL3 (GOWN DISPOSABLE) ×3 IMPLANT
KIT ABG SYR 3ML LUER SLIP (SYRINGE) IMPLANT
NDL HYPO 25X5/8 SAFETYGLIDE (NEEDLE) IMPLANT
NEEDLE HYPO 25X5/8 SAFETYGLIDE (NEEDLE) IMPLANT
NS IRRIG 1000ML POUR BTL (IV SOLUTION) ×1 IMPLANT
PACK C SECTION WH (CUSTOM PROCEDURE TRAY) ×1 IMPLANT
PAD OB MATERNITY 4.3X12.25 (PERSONAL CARE ITEMS) ×1 IMPLANT
RTRCTR C-SECT PINK 25CM LRG (MISCELLANEOUS) ×1 IMPLANT
SUT MNCRL 0 VIOLET CTX 36 (SUTURE) ×2 IMPLANT
SUT MONOCRYL 0 CTX 36 (SUTURE) ×2
SUT VIC AB 0 CTX 36 (SUTURE) ×1
SUT VIC AB 0 CTX36XBRD ANBCTRL (SUTURE) ×1 IMPLANT
SUT VIC AB 2-0 CT1 27 (SUTURE) ×1
SUT VIC AB 2-0 CT1 TAPERPNT 27 (SUTURE) ×1 IMPLANT
SUT VIC AB 4-0 KS 27 (SUTURE) ×1 IMPLANT
TOWEL OR 17X24 6PK STRL BLUE (TOWEL DISPOSABLE) ×1 IMPLANT
TRAY FOLEY W/BAG SLVR 14FR LF (SET/KITS/TRAYS/PACK) ×1 IMPLANT
WATER STERILE IRR 1000ML POUR (IV SOLUTION) ×1 IMPLANT

## 2022-05-05 NOTE — Transfer of Care (Signed)
Immediate Anesthesia Transfer of Care Note  Patient: Sierra Daniels  Procedure(s) Performed: CESAREAN SECTION  Patient Location: PACU  Anesthesia Type:Spinal  Level of Consciousness: awake, alert , and oriented  Airway & Oxygen Therapy: Patient Spontanous Breathing  Post-op Assessment: Report given to RN and Post -op Vital signs reviewed and stable  Post vital signs: Reviewed and stable  Last Vitals:  Vitals Value Taken Time  BP 111/71 05/05/22 1804  Temp    Pulse 66 05/05/22 1806  Resp 16 05/05/22 1805  SpO2 100 % 05/05/22 1806  Vitals shown include unvalidated device data.  Last Pain:  Vitals:   05/05/22 1427  TempSrc: Oral  PainSc: 0-No pain         Complications: No notable events documented.

## 2022-05-05 NOTE — Op Note (Signed)
Sierra Daniels PROCEDURE DATE: 05/05/2022  PREOPERATIVE DIAGNOSES: Intrauterine pregnancy at 63w0dweeks gestation; malpresentation: transverse breech Syphilis and IUGR  POSTOPERATIVE DIAGNOSES: The same, viable infant delivered  PROCEDURE: Primary Low Transverse Cesarean Section with bilateral salpingectomy  SURGEON:  Dr. JLoma Boston ASSISTANT:  JShelda Pal DO An experienced assistant was required given the standard of surgical care given the complexity of the case.  This assistant was needed for exposure, dissection, suctioning, retraction, instrument exchange, assisting with delivery with administration of fundal pressure, and for overall help during the procedure.  ANESTHESIOLOGY TEAM: Anesthesiologist: HBrennan Bailey MD CRNA: RElenore Paddy CRNA  INDICATIONS: Sierra Fiorenzais a 27y.o. G2P1001 at 338w0dere for cesarean section secondary to the indications listed under preoperative diagnoses; please see preoperative note for further details.  The risks of surgery were discussed with the patient including but were not limited to: bleeding which may require transfusion or reoperation; infection which may require antibiotics; injury to bowel, bladder, ureters or other surrounding organs; injury to the fetus; need for additional procedures including hysterectomy in the event of a life-threatening hemorrhage; formation of adhesions; placental abnormalities wth subsequent pregnancies; incisional problems; thromboembolic phenomenon and other postoperative/anesthesia complications.  The patient concurred with the proposed plan, giving informed written consent for the procedure.    FINDINGS:   Baby A: Viable female infant in cephalic presentation.  Apgars 8 and 9.  Amniotic fluid: clear.   Baby B: Viable female infant in cephalic presentation.  Apgars 8 and 9.  Amniotic fluid: clear.   Intact placenta, three vessel cord.  Normal uterus, fallopian tubes and ovaries  bilaterally.  ANESTHESIA: spinal INTRAVENOUS FLUIDS: 1300 ml   ESTIMATED BLOOD LOSS: 273 ml URINE OUTPUT:  150 ml SPECIMENS: Placenta sent to L&D ., Right and left fallopian tubes COMPLICATIONS: None immediate  PROCEDURE IN DETAIL:  The patient preoperatively received intravenous antibiotics and had sequential compression devices applied to her lower extremities.  She was then taken to the operating room where spinal anesthesia was found to be adequate. She was then placed in a dorsal supine position with a leftward tilt, and prepped and draped in a sterile manner.  A foley catheter was  placed into her bladder and attached to constant gravity.  After an adequate timeout was performed, a Pfannenstiel skin incision was made with scalpel and carried through to the underlying layer of fascia. The fascia was incised in the midline, and this incision was extended bluntly. The rectus muscles were separated in the midline and the peritoneum was entered bluntly.   The Alexis self-retaining retractor was introduced into the abdominal cavity.  Attention was turned to the lower uterine segment where a low transverse hysterotomy was made with a scalpel and extended bluntly in caudad and cephalad directions.  Infant A was successfully delivered, the cord was clamped and cut after one minute, and the infant was handed over to the awaiting neonatology team. Th amniotic sac for baby B was punctured with an allis. Baby B was extracted from footling breech position successfully,  the cord was clamped and cut after one minute, and the infant was handed over to the awaiting neonatology team.  Uterine massage was then administered, and the placentas were delivered intact with a three-vessel cords. The uterus was then cleared of clots and debris.  The hysterotomy was closed with 0-Monocryl in a running fashion.     Attention was then turned to the patient's uterus, and left fallopian tube was identified and  followed out to  the fimbriated end.  Ligasure device was used to cauterize and cut the mesosalpinx to proximal end of the fallopian tube, removing 6cm of tube. A similar process was carried out on the right side allowing for bilateral tubal sterilization.   The pelvis was cleared of all clot and debris. Hemostasis was confirmed on all surfaces.  The uterus was was once again inspected and found to be hemostatic. The retractor was removed.  The peritoneum was closed with a 2-0 Vicryl running stitch. The fascia was then closed using 0 Vicryl in a running fashion.  The subcutaneous layer was irrigated, any areas of bleeding were cauterized with the bovie,  was found to be hemostatic.Marland KitchenMarland KitchenThe skin was closed with a 4-0 Vicryl subcuticular stitch. The patient tolerated the procedure well. Sponge, instrument and needle counts were correct x 3.  She was taken to the recovery room in stable condition.   Shelda Pal, [place] Fellow, Faculty practice [place] for [place] 05/05/22  5:54 PM

## 2022-05-05 NOTE — Anesthesia Procedure Notes (Signed)
Spinal  Patient location during procedure: OR Start time: 05/05/2022 4:50 PM End time: 05/05/2022 4:53 PM Reason for block: surgical anesthesia Staffing Performed: anesthesiologist  Anesthesiologist: Brennan Bailey, MD Performed by: Brennan Bailey, MD Authorized by: Brennan Bailey, MD   Preanesthetic Checklist Completed: patient identified, IV checked, risks and benefits discussed, monitors and equipment checked, pre-op evaluation and timeout performed Spinal Block Patient position: sitting Prep: DuraPrep and site prepped and draped Patient monitoring: heart rate, continuous pulse ox and blood pressure Approach: midline Location: L3-4 Injection technique: single-shot Needle Needle type: Pencan  Needle gauge: 24 G Needle length: 10 cm Assessment Sensory level: T4 Events: CSF return Additional Notes Risks, benefits, and alternative discussed. Patient gave consent to procedure. Prepped and draped in sitting position. Clear CSF obtained after one needle pass. Positive terminal aspiration. No pain or paraesthesias with injection. Patient tolerated procedure well. Vital signs stable. Tawny Asal, MD

## 2022-05-05 NOTE — Anesthesia Preprocedure Evaluation (Addendum)
Anesthesia Evaluation  Patient identified by MRN, date of birth, ID band Patient awake    Reviewed: Allergy & Precautions, NPO status , Patient's Chart, lab work & pertinent test results  History of Anesthesia Complications Negative for: history of anesthetic complications  Airway Mallampati: II  TM Distance: >3 FB Neck ROM: Full    Dental  (+) Chipped,    Pulmonary Current Smoker and Patient abstained from smoking.   Pulmonary exam normal        Cardiovascular negative cardio ROS Normal cardiovascular exam     Neuro/Psych scoliosis    GI/Hepatic negative GI ROS,,,(+)     substance abuse  cocaine use  Endo/Other  diabetes, Gestational, Oral Hypoglycemic Agents    Renal/GU negative Renal ROS     Musculoskeletal   Abdominal   Peds  Hematology  (+) Blood dyscrasia (Hgb 9.2), anemia   Anesthesia Other Findings   Reproductive/Obstetrics (+) Pregnancy (twin gestation, IUGR)                              Anesthesia Physical Anesthesia Plan  ASA: 3  Anesthesia Plan: Spinal   Post-op Pain Management: Ofirmev IV (intra-op)*   Induction:   PONV Risk Score and Plan: 4 or greater and Treatment may vary due to age or medical condition, Ondansetron and Dexamethasone  Airway Management Planned: Natural Airway  Additional Equipment: None  Intra-op Plan:   Post-operative Plan:   Informed Consent: I have reviewed the patients History and Physical, chart, labs and discussed the procedure including the risks, benefits and alternatives for the proposed anesthesia with the patient or authorized representative who has indicated his/her understanding and acceptance.       Plan Discussed with: CRNA  Anesthesia Plan Comments:         Anesthesia Quick Evaluation

## 2022-05-05 NOTE — BH Specialist Note (Deleted)
Pt did not arrive to video visit and did not answer the phone; Left HIPPA-compliant message to call back Viyan Rosamond from [facility] at  [facility] at  [phone] (Orion Mole's office).  ?; left MyChart message for patient.  ? ?

## 2022-05-05 NOTE — H&P (Signed)
Faculty Practice H&P  Sierra Daniels is a 27 y.o. female G2P1001 with IUP at 11w0dpresenting for primary cesarean section for FGR, transverse baby B, back down. Pregnancy was been complicated by insufficient prenatal care, GDM, drug use in pregnancy, syphilis in pregnancy.  She was diagnosed with syphilis in November, but never got treatment despite the office trying to get in touch with her. She did get a dose of PCN G on 1/22 and 1/30.   Pt states she has been having no contractions, no vaginal bleeding, intact membranes, with normal fetal movement.     Prenatal Course Source of Care: [facility] with onset of care at 338weeks  Pregnancy complications or risks: Patient Active Problem List   Diagnosis Date Noted   IUGR (intrauterine growth restriction) affecting care of mother, third trimester, not applicable or unspecified fetus 05/04/2022   Supervision of high risk pregnancy, antepartum 04/26/2022   Syphilis affecting pregnancy in third trimester 04/26/2022   Dichorionic diamniotic twin pregnancy in third trimester 04/26/2022   Limited prenatal care in third trimester 028/31/5176  Drug use complicating pregnancy 016/10/3708  GDM (gestational diabetes mellitus) 01/27/2018   Scoliosis of thoracic spine 01/10/2018   Depressed mood 08/16/2017   She desires bilateral tubal ligation for contraception.  She plans to breastfeed, plans to bottle feed  Prenatal labs and studies: ABO, Rh: B/Positive/-- (01/22 1557) Antibody: Negative (01/22 1557) Rubella: 8.58 (01/22 1557) RPR: Reactive (01/22 1557)  HBsAg: Negative (01/22 1557)  HIV: Non Reactive (01/22 1557)  GBS: Negative/-- (01/22 1652)  2hr Glucola: HgA1c 6.2 Genetic screening:  Anatomy [place]  FGR  Past Medical History:  Past Medical History:  Diagnosis Date   Gestational diabetes    metformin   Herpes 2018   Scoliosis     Past Surgical History:  Past Surgical History:  Procedure Laterality Date   NO PAST SURGERIES       Obstetrical History:  OB History     Gravida  2   Para  1   Term  1   Preterm      AB      Living  1      SAB      IAB      Ectopic      Multiple  0   Live Births  1           Gynecological History:  OB History     Gravida  2   Para  1   Term  1   Preterm      AB      Living  1      SAB      IAB      Ectopic      Multiple  0   Live Births  1           Social History:  Social History   Socioeconomic History   Marital status: Single    Spouse name: Not on file   Number of children: Not on file   Years of education: Not on file   Highest education level: Not on file  Occupational History   Not on file  Tobacco Use   Smoking status: Some Days    Types: Cigars    Last attempt to quit: 08/02/2017    Years since quitting: 4.7   Smokeless tobacco: Never  Vaping Use   Vaping Use: Never used  Substance and Sexual Activity   Alcohol use: Not Currently  Drug use: Not Currently    Types: Cocaine    Comment: last use in November 2023   Sexual activity: Not Currently    Birth control/protection: Injection  Other Topics Concern   Not on file  Social History Narrative   Not on file   Social Determinants of Health   Financial Resource Strain: Low Risk  (01/19/2018)   Overall Financial Resource Strain (CARDIA)    Difficulty of Paying Living Expenses: Not hard at all  Food Insecurity: No Food Insecurity (04/26/2022)   Hunger Vital Sign    Worried About Running Out of Food in the Last Year: Never true    Ran Out of Food in the Last Year: Never true  Transportation Needs: Unmet Transportation Needs (04/26/2022)   PRAPARE - Transportation    Lack of Transportation (Medical): Yes    Lack of Transportation (Non-Medical): Yes  Physical Activity: Not on file  Stress: No Stress Concern Present (01/19/2018)   [place]    Feeling of Stress : Only a little  Social  Connections: Not on file    Family History:  Family History  Problem Relation Age of Onset   Arthritis Mother    Diabetes Mother    Anxiety disorder Neg Hx    Alcohol abuse Neg Hx    ADD / ADHD Neg Hx    Asthma Neg Hx    Birth defects Neg Hx    Cancer Neg Hx    COPD Neg Hx    Depression Neg Hx    Drug abuse Neg Hx    Early death Neg Hx    Hearing loss Neg Hx    Heart disease Neg Hx    Hypertension Neg Hx    Hyperlipidemia Neg Hx    Intellectual disability Neg Hx    Kidney disease Neg Hx    Miscarriages / Stillbirths Neg Hx    Learning disabilities Neg Hx    Obesity Neg Hx    Stroke Neg Hx    Vision loss Neg Hx    Varicose Veins Neg Hx     Medications:  Prenatal vitamins,  No current facility-administered medications for this encounter.    Allergies: No Known Allergies  Review of Systems: -  negative  Physical Exam: Last menstrual period 08/19/2021. GENERAL: Well-developed, well-nourished female in no acute distress.  LUNGS: Clear to auscultation bilaterally.  HEART: Regular rate and rhythm. ABDOMEN: Soft, nontender, nondistended, gravid.  EXTREMITIES: Nontender, no edema, 2+ distal pulses.   Pertinent Labs/Studies:   Lab Results  Component Value Date   WBC 8.3 04/26/2022   HGB 9.8 (L) 04/26/2022   HCT 30.8 (L) 04/26/2022   MCV 78 (L) 04/26/2022   PLT 308 04/26/2022    Assessment : Sierra Daniels is a 27 y.o. G2P1001 at 51w0dbeing admitted for cesarean section secondary to FKermitx2.  Plan: Bedside [place]one - Baby A vertex, Baby B transverse back down lie.  I had a lengthy conversation with the patient regarding mode of delivery. Discussed risks of baby not tolerating labor, need for emergent cesarean section, vaginal delivery for first baby, cesarean for the second. Patient opted for primary section.  The risks of cesarean section discussed with the patient included but were not limited to: bleeding which may require transfusion or reoperation; infection  which may require antibiotics; injury to bowel, bladder, ureters or other surrounding organs; injury to the fetus; need for additional procedures including hysterectomy in the event  of a life-threatening hemorrhage; placental abnormalities wth subsequent pregnancies, incisional problems, thromboembolic phenomenon and other postoperative/anesthesia complications. The patient concurred with the proposed plan, giving informed written consent for the procedure.   Patient has been NPO since last night and will remain NPO for procedure.  Preoperative prophylactic Ancef ordered on call to the OR.     Truett Mainland, DO 05/05/2022, 1:38 PM

## 2022-05-05 NOTE — Discharge Summary (Signed)
Postpartum Discharge Summary  Date of Service updated: 05/08/22    Patient Name: Sierra Daniels DOB: 06/03/1995 MRN: [id number]  Date of admission: 05/05/2022 Delivery date:   Sierra, Daniels [[id number]]  05/05/2022    Sierra, Daniels [[id number]]  05/05/2022  Delivering provider:    Caryle, Daniels [[id number]]  Sierra, Daniels [[id number]]  Truett Mainland  Date of discharge: 05/08/2022  Admitting diagnosis: Status post primary low transverse cesarean section [Z98.891] Intrauterine pregnancy: [redacted]w[redacted]d    Secondary diagnosis:  Principal Problem:   Status post primary low transverse cesarean section Active Problems:   Depressed mood   Scoliosis of thoracic spine   GDM (gestational diabetes mellitus)   Supervision of high risk pregnancy, antepartum   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 yo G2P2003 at 343w0das admitted to the hospital 05/05/2022 for scheduled cesarean section with the following indication:Malpresentation and Syphilis .Delivery details are as follows:  Membrane Rupture Time/Date:    Sierra, Daniels[[id number]]5:12 PM    Sierra, GiThornton Daniels[[id number]]5:15 PM ,   Sierra, Daniels[[id number]]05/05/2022    Sierra, Daniels[[id number]]05/05/2022   Delivery Method:   Sierra Daniels[[id number]]C-Section, Low Transverse    Sierra, Daniels[[id number]]C-Section, Low Transverse  Details of operation can be found in separate operative  note.  Patient had a postpartum course was uncomplicated.  She is ambulating, tolerating a regular diet, passing flatus, and urinating well. Patient is discharged home in stable condition on  05/08/22        Newborn Data: Birth date:   Sierra, Daniels[[id number]]05/05/2022    Sierra, Daniels[[id number]]05/05/2022  Birth time:   Sierra, Daniels[[id number]]5:13 PM    Sierra, GiThornton Daniels[[id number]]5:16 PM  Gender:   Sierra, Daniels[[id number]]Female    Sierra, Daniels[[id number]]Female  Living status:   Sierra, Daniels[[id number]]  BLTJQZ    Sierra, ZRAQT Daniels[[id number]]Living  Apgars:   Sierra, Daniels[[id number]][address][[id number]]8 [place]   Sierra, Daniels[[id number]]9 Sierra Daniels[[id number]]9  Weight:   Sierra, Daniels[[id number]]2150 g    Sierra, Daniels[[id number]]2350 g     Magnesium Sulfate received: No BMZ received: No Rhophylac:N/A MMR:N/A T-DaP: NA Flu: No Transfusion:No  Physical exam  Vitals:   05/07/22 1559 05/07/22 1916 05/07/22 2356 05/08/22 0813  BP: 124/65 125/76 120/74 110/65  Pulse: 67 77 (!) 108 82  Resp: '17 18 19 18  '$ Temp: 98.3 F (36.8 C) 98.5 F (36.9 C)  97.9  F (36.Sierra C)  TempSrc: Oral Oral  Oral  SpO2: 100% 100% 100% 100%  Weight:      Height:       General: alert Lochia: appropriate Uterine Fundus: firm Incision: Healing well with no significant drainage DVT Evaluation: No evidence of DVT seen on physical exam. Labs: Lab Results  Component Value Date   WBC 16.3 (H) 05/06/2022   HGB 7.9 (L) 05/06/2022   HCT 26.0 (L) 05/06/2022   MCV 79.8 (L) 05/06/2022   PLT 246 05/06/2022      Latest Ref Rng & Units 10/14/2021    5:47 PM  CMP  Glucose 70 - 99 mg/dL 91   BUN Sierra - 20 mg/dL Sierra   Creatinine 0.44 - 1.00 mg/dL 0.75   Sodium 135 - 145 mmol/L 140   Potassium 3.5 - 5.1 mmol/L 3.8   Chloride 98 - 111 mmol/L 100   CO2 22 - 32 mmol/L 26   Calcium  8.9 - 10.3 mg/dL 9.8   Total Protein Sierra.5 - 8.1 g/dL Sierra.7   Total Bilirubin 0.3 - 1.2 mg/dL 0.5   Alkaline Phos 38 - 126 U/L 51   AST 15 - 41 U/L 18   ALT 0 - 44 U/L 17    Edinburgh Score:    05/08/2022   12:00 AM  Edinburgh Postnatal Depression Scale Screening Tool  I have been able to laugh and see the funny side of things. 0  I have looked forward with enjoyment to things. 0  I have blamed myself unnecessarily when things went wrong. 1  I have been anxious or worried for no good reason. 0  I have felt scared or panicky for no good reason. 1  Things have been getting on top of me. 1  I have been so unhappy that I have had difficulty sleeping. 0  I have felt sad or miserable. 0  I have been so unhappy that I have been crying. 0  The thought of harming myself has occurred to me. 0  Edinburgh Postnatal Depression Scale Total 3     After visit meds:  Allergies as of 05/08/2022   No Known Allergies      Medication List     STOP taking these medications    metroNIDAZOLE 500 MG tablet Commonly known as: FLAGYL       TAKE these medications    furosemide 20 MG tablet Commonly known as: LASIX Take 1 tablet (20 mg total) by mouth daily.   ibuprofen 600 MG tablet Commonly known as: ADVIL Take 1 tablet (600 mg total) by mouth every Sierra (six) hours.   oxyCODONE 5 MG immediate release tablet Commonly known as: Oxy IR/ROXICODONE Take 1 tablet (5 mg total) by mouth every 4 (four) hours as needed for moderate pain.   prenatal multivitamin Tabs tablet Take 1 tablet by mouth daily at 12 noon.         Discharge home in stable condition Infant Feeding: Bottle Infant Disposition:NICU Discharge instruction: per After Visit Summary and Postpartum booklet. Activity: Advance as tolerated. Pelvic rest for Sierra weeks.  Diet: routine diet Future Appointments: Future Appointments  Date Time Provider Power  05/14/2022  8:50 AM [facility] LAB [facility]  05/14/2022  9:20 AM  [facility] NURSE [facility]  05/19/2022  9:15 AM [facility] CL[facility]  06/11/2022  8:35 AM Ernestina Patches, Juanita Craver, MD [facility]   Follow up Visit:  [place] for [place]  at [facility] Follow up.   Specialty: Obstetrics and Gynecology Contact information: [place] [address] [phone]               Message sent to [facility] 1/31  Please schedule this patient for a In person postpartum visit in Sierra weeks with the following provider: Any provider. Additional Postpartum F/U:Postpartum Depression checkup, 2 hour GTT, and Incision check 1 week  High risk pregnancy complicated by: GDM Delivery mode:     Kaelei, Wheeler [[id number]]  C-Section, Low Transverse    Norita, Meigs [[id number]]  C-Section, Low Transverse  Anticipated Birth Control:  BTL done [facility]   05/08/2022 Chancy Milroy, MD

## 2022-05-05 NOTE — Anesthesia Postprocedure Evaluation (Signed)
Anesthesia Post Note  Patient: Technical sales engineer  Procedure(s) Performed: CESAREAN SECTION     Patient location during evaluation: PACU Anesthesia Type: Spinal Level of consciousness: awake and alert Pain management: pain level controlled Vital Signs Assessment: post-procedure vital signs reviewed and stable Respiratory status: spontaneous breathing, nonlabored ventilation and respiratory function stable Cardiovascular status: blood pressure returned to baseline Postop Assessment: no apparent nausea or vomiting, spinal receding, no headache and no backache Anesthetic complications: no   No notable events documented.  Last Vitals:  Vitals:   05/05/22 1930 05/05/22 2016  BP: (!) 126/101 124/80  Pulse: (!) 56 66  Resp: 17 18  Temp:  36.8 C  SpO2: 98% 100%    Last Pain:  Vitals:   05/05/22 2016  TempSrc: Oral  PainSc: 0-No pain   Pain Goal:                   Marthenia Rolling

## 2022-05-06 ENCOUNTER — Encounter (HOSPITAL_COMMUNITY): Payer: Self-pay | Admitting: Family Medicine

## 2022-05-06 LAB — CBC
HCT: 26 % — ABNORMAL LOW (ref 36.0–46.0)
Hemoglobin: 7.9 g/dL — ABNORMAL LOW (ref 12.0–15.0)
MCH: 24.2 pg — ABNORMAL LOW (ref 26.0–34.0)
MCHC: 30.4 g/dL (ref 30.0–36.0)
MCV: 79.8 fL — ABNORMAL LOW (ref 80.0–100.0)
Platelets: 246 10*3/uL (ref 150–400)
RBC: 3.26 MIL/uL — ABNORMAL LOW (ref 3.87–5.11)
RDW: 14.8 % (ref 11.5–15.5)
WBC: 16.3 10*3/uL — ABNORMAL HIGH (ref 4.0–10.5)
nRBC: 0.2 % (ref 0.0–0.2)

## 2022-05-06 LAB — RPR
RPR Ser Ql: REACTIVE — AB
RPR Titer: 1:4 {titer}

## 2022-05-06 MED ORDER — SODIUM CHLORIDE 0.9 % IV SOLN
500.0000 mg | Freq: Once | INTRAVENOUS | Status: AC
  Administered 2022-05-06: 500 mg via INTRAVENOUS
  Filled 2022-05-06: qty 500
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
## 2022-05-06 NOTE — Progress Notes (Signed)
Post Partum Day 1 - pLTCS & BS Subjective: no complaints and tolerating PO. Foley removed this AM, awaiting SV. Not yet OOB.  Objective: Blood pressure 115/81, pulse 92, temperature (!) 97.3 F (36.3 C), temperature source Oral, resp. rate 18, height '5\' 7"'$  (1.702 m), weight 81 kg, last menstrual period 08/19/2021, SpO2 100 %, unknown if currently breastfeeding.  Physical Exam:  General: alert, cooperative, and no distress Lochia: appropriate Uterine Fundus: firm Incision: honeycomb in place with no shadowing DVT Evaluation: No evidence of DVT seen on physical exam.  Recent Labs    05/05/22 1347 05/06/22 0520  HGB 9.2* 7.9*  HCT 30.7* 26.0*    Assessment/Plan: Postpartum - Contraception: s/p BS - MOF: Formula - Rh status: Rh+ - Rubella status: RI - Dispo: POD3-4 - Consults: n/a  Neonatal - Female infants in NICU  3. Post operative anemia - IV iron ordered  4. History of syphilis - RPR pending   LOS: 1 day   Inez Catalina 05/06/2022, 7:29 AM

## 2022-05-07 LAB — TOXASSURE SELECT 13 (MW), URINE

## 2022-05-07 LAB — T.PALLIDUM AB, TOTAL: T Pallidum Abs: REACTIVE — AB

## 2022-05-07 MED ORDER — FUROSEMIDE 20 MG PO TABS
20.0000 mg | ORAL_TABLET | Freq: Every day | ORAL | Status: DC
  Administered 2022-05-07 – 2022-05-08 (×2): 20 mg via ORAL
  Filled 2022-05-07 (×2): qty 1

## 2022-05-07 NOTE — Progress Notes (Signed)
POD# 2 LTCS with BTL  Subjective:  Patient has no complaints this morning. Ambulating, voiding, tolerating diet and good oral pain control Denies HA or visual changes  Objective: Vital signs in last 24 hours: Temp:  [97.3 F (36.3 C)-98.5 F (36.9 C)] 97.8 F (36.6 C) (02/02 0835) Pulse Rate:  [61-76] 61 (02/02 0835) Resp:  [16-17] 17 (02/02 0835) BP: (112-147)/(61-91) 119/71 (02/02 0835) SpO2:  [98 %-100 %] 98 % (02/02 0835)  Physical Exam:  General: alert Lochia: appropriate Uterine Fundus: firm Incision: healing well DVT Evaluation: No evidence of DVT seen on physical exam.  Recent Labs    05/05/22 1347 05/06/22 0520  HGB 9.2* 7.9*  HCT 30.7* 26.0*    Assessment/Plan: Status post Cesarean section. Doing well postoperatively.  Plan for discharge home tomorrow. Continue current care.  Chancy Milroy, MD 05/07/2022, 10:36 AM

## 2022-05-08 ENCOUNTER — Inpatient Hospital Stay (HOSPITAL_COMMUNITY)
Admission: AD | Admit: 2022-05-08 | Discharge: 2022-05-09 | Disposition: A | Discharge status: Home / Self Care | Payer: Medicaid Other | Attending: Emergency Medicine | Admitting: Emergency Medicine

## 2022-05-08 DIAGNOSIS — R600 Localized edema: Secondary | ICD-10-CM | POA: Insufficient documentation

## 2022-05-08 DIAGNOSIS — Z79899 Other long term (current) drug therapy: Secondary | ICD-10-CM | POA: Insufficient documentation

## 2022-05-08 DIAGNOSIS — M79601 Pain in right arm: Secondary | ICD-10-CM | POA: Diagnosis not present

## 2022-05-08 DIAGNOSIS — I808 Phlebitis and thrombophlebitis of other sites: Secondary | ICD-10-CM

## 2022-05-08 MED ORDER — OXYCODONE HCL 5 MG PO TABS: 5.0000 mg | ORAL_TABLET | ORAL | 0 refills | Status: DC | PRN

## 2022-05-08 MED ORDER — IBUPROFEN 800 MG PO TABS
800.0000 mg | ORAL_TABLET | Freq: Once | ORAL | Status: AC
  Administered 2022-05-08: 800 mg via ORAL
  Filled 2022-05-08: qty 1

## 2022-05-08 MED ORDER — IBUPROFEN 600 MG PO TABS: 600.0000 mg | ORAL_TABLET | Freq: Four times a day (QID) | ORAL | 0 refills | Status: DC

## 2022-05-08 MED ORDER — FUROSEMIDE 20 MG PO TABS: 20.0000 mg | ORAL_TABLET | Freq: Every day | ORAL | 0 refills | Status: DC

## 2022-05-08 NOTE — MAU Note (Signed)
Report given to ED charge nurse Janett Billow, RN

## 2022-05-08 NOTE — MAU Note (Signed)
..  Sierra Daniels is a 27 y.o. at [facility] c-section here in MAU reporting: right arm swelling. Reports she had an IV line on that forearm. Reports that she has weakness on that arm and can not use it to get up.   Pain score: /10 Vitals:   05/08/22 2148  BP: 127/83  Pulse: (!) 101  Resp: 17  Temp: 98.7 F (37.1 C)  SpO2: 100%      Emly, CNM at bedside

## 2022-05-08 NOTE — MAU Provider Note (Cosign Needed Addendum)
History     CSN: [id number]  Arrival date and time: 05/08/22 2128   Event Date/Time   First Provider Initiated Contact with Patient 05/08/22 2150     Chief Complaint  Patient presents with   Arm Swelling   HPI  Sierra Daniels is a 27 y.o. G2P2003 at 3 Days Postpartum from a Primary C/S with who receives care at [facility].  She presents today for right arm pain and swelling.  She states she has been experiencing this pain since her IV infiltrated on 2/1.  She states she feels like the symptoms has gradually gotten worse despite cold compress.  She states she has not taken anything for the pain and rates it a 9/10.  She states the pain is worse with laying on her arm, but has no relief measures.   OB History     Gravida  2   Para  2   Term  2   Preterm      AB      Living  3      SAB      IAB      Ectopic      Multiple  1   Live Births  3           Past Medical History:  Diagnosis Date   Gestational diabetes    metformin   Herpes 2018   Scoliosis     Past Surgical History:  Procedure Laterality Date   CESAREAN SECTION MULTI-GESTATIONAL N/A 05/05/2022   Procedure: CESAREAN SECTION MULTI-GESTATIONAL;  Surgeon: Truett Mainland, DO;  Location: [facility] LD ORS;  Service: Obstetrics;  Laterality: N/A;   NO PAST SURGERIES      Family History  Problem Relation Age of Onset   Arthritis Mother    Diabetes Mother    Anxiety disorder Neg Hx    Alcohol abuse Neg Hx    ADD / ADHD Neg Hx    Asthma Neg Hx    Birth defects Neg Hx    Cancer Neg Hx    COPD Neg Hx    Depression Neg Hx    Drug abuse Neg Hx    Early death Neg Hx    Hearing loss Neg Hx    Heart disease Neg Hx    Hypertension Neg Hx    Hyperlipidemia Neg Hx    Intellectual disability Neg Hx    Kidney disease Neg Hx    Miscarriages / Stillbirths Neg Hx    Learning disabilities Neg Hx    Obesity Neg Hx    Stroke Neg Hx    Vision loss Neg Hx    Varicose Veins Neg Hx     Social History   Tobacco Use    Smoking status: Some Days    Types: Cigars    Last attempt to quit: 08/02/2017    Years since quitting: 4.7   Smokeless tobacco: Never  Vaping Use   Vaping Use: Never used  Substance Use Topics   Alcohol use: Not Currently   Drug use: Not Currently    Types: Cocaine    Comment: last use in November 2023    Allergies: No Known Allergies  Medications Prior to Admission  Medication Sig Dispense Refill Last Dose   furosemide (LASIX) 20 MG tablet Take 1 tablet (20 mg total) by mouth daily. 3 tablet 0    ibuprofen (ADVIL) 600 MG tablet Take 1 tablet (600 mg total) by mouth every 6 (six) hours. 30 tablet 0  oxyCODONE (OXY IR/ROXICODONE) 5 MG immediate release tablet Take 1 tablet (5 mg total) by mouth every 4 (four) hours as needed for moderate pain. 30 tablet 0    Prenatal Vit-Fe Fumarate-FA (PRENATAL MULTIVITAMIN) TABS tablet Take 1 tablet by mouth daily at 12 noon.       Review of Systems  Constitutional:  Negative for chills and fever.  Neurological:  Negative for dizziness, light-headedness and headaches.   Physical Exam   Blood pressure 127/83, pulse (!) 101, temperature 98.7 F (37.1 C), temperature source Oral, resp. rate 17, height '5\' 7"'$  (1.702 m), weight 70.5 kg, last menstrual period 08/19/2021, SpO2 100 %, unknown if currently breastfeeding.  Physical Exam Vitals reviewed.  Constitutional:      General: She is not in acute distress.    Appearance: Normal appearance. She is not toxic-appearing.  HENT:     Head: Normocephalic and atraumatic.  Eyes:     Conjunctiva/sclera: Conjunctivae normal.  Cardiovascular:     Rate and Rhythm: Tachycardia present.  Pulmonary:     Effort: Pulmonary effort is normal. No respiratory distress.  Musculoskeletal:     Right shoulder: No tenderness. Normal range of motion.     Right elbow: Swelling present. Decreased range of motion. Tenderness present.     Right wrist: Normal range of motion.       Arms:     Cervical back:  Normal range of motion.     Comments: Right arm with edema and redness as above.  Unable to bend, but able to wiggle fingers and raise (bent) arm above head with minimal effort and pain.   Neurological:     Mental Status: She is alert and oriented to person, place, and time.  Psychiatric:        Mood and Affect: Mood normal.        Behavior: Behavior normal.    MAU Course  Procedures No results found for this or any previous visit (from the past 24 hour(s)).   MDM Pain Medication Consult Assessment and Plan  27 year old, G26P2003  Postpartum State Right Arm Edema and Pain  -Reviewed POC with patient. -Exam performed and findings discussed.  -Consult with Dr. Chauncey Cruel. Autry-Lott on best imaging and advices either venous doppler or [place] of Soft tissue. -Provider to proceed with both as venous doppler closed for tonight.  -Ibuprofen '800mg'$  ordered. -Apply cold compress to area.   [name] 05/08/2022, 9:56 PM  Reassessment (10:44 PM) -Ultrasonographer calls and requests provider contact Radiology for guidance on appropriate imaging. -Dr. Fransico Meadow contacted and states Dr. Chauncey Cruel. Autry-Lott will be down to examine patient and modify POC as appropriate.  [name] MSN, CNM Advanced Practice Provider, [facility] [place] care of this patient at time documented above. 27 yo F POD#3 from primary C/S for malpresentation twins. Visiting twin infants in NICU. Patient found to have RUE edema with significant pain to touch x3 days following IV infiltration. She is unable to extend her elbow, wrist or fingers. Able to weakly make a fist. Sensation intact bilaterally. She has symmetric radial pulses. There is no evidence of skin changes that would indicate infection. With worsening edema and now with significantly tender to touch, and decreased ROM would highly consider DVT and compartment syndrome.   2300-Discussed patient with Dr. Betsey Holiday in [facility] who suggested consulting ortho.    2302- Discussed patient with Dr. Sammuel Hines (Ortho) who suggested to call hand.  2316- Discussed patient with Dr. Grandville Silos (  Hand) who has a lower suspicion for compartment syndrome but unsure of etiology at this time suggested [facility] transfer and then will plan to come see if necessary following ED provider evaluation. Charge RN notified, Dr. Betsey Holiday to accept patient. MAU RN notified and will coordinate transfer. Patient aware of plan and agreeable.   Gerlene Fee, DO OB Fellow, [place] [facility] [place] 05/08/2022, 11:53 PM

## 2022-05-09 ENCOUNTER — Encounter (HOSPITAL_COMMUNITY): Payer: Self-pay | Admitting: Family Medicine

## 2022-05-09 ENCOUNTER — Inpatient Hospital Stay (HOSPITAL_COMMUNITY): Admission: RE | Admit: 2022-05-09 | Payer: Medicaid Other | Source: Ambulatory Visit

## 2022-05-09 ENCOUNTER — Other Ambulatory Visit: Payer: Self-pay

## 2022-05-09 LAB — CBC WITH DIFFERENTIAL/PLATELET
Abs Immature Granulocytes: 0.18 10*3/uL — ABNORMAL HIGH (ref 0.00–0.07)
Basophils Absolute: 0 10*3/uL (ref 0.0–0.1)
Basophils Relative: 0 %
Eosinophils Absolute: 0.3 10*3/uL (ref 0.0–0.5)
Eosinophils Relative: 2 %
HCT: 24.9 % — ABNORMAL LOW (ref 36.0–46.0)
Hemoglobin: 7.9 g/dL — ABNORMAL LOW (ref 12.0–15.0)
Immature Granulocytes: 1 %
Lymphocytes Relative: 12 %
Lymphs Abs: 1.9 10*3/uL (ref 0.7–4.0)
MCH: 24.9 pg — ABNORMAL LOW (ref 26.0–34.0)
MCHC: 31.7 g/dL (ref 30.0–36.0)
MCV: 78.5 fL — ABNORMAL LOW (ref 80.0–100.0)
Monocytes Absolute: 1.2 10*3/uL — ABNORMAL HIGH (ref 0.1–1.0)
Monocytes Relative: 8 %
Neutro Abs: 12.3 10*3/uL — ABNORMAL HIGH (ref 1.7–7.7)
Neutrophils Relative %: 77 %
Platelets: 365 10*3/uL (ref 150–400)
RBC: 3.17 MIL/uL — ABNORMAL LOW (ref 3.87–5.11)
RDW: 15.3 % (ref 11.5–15.5)
WBC: 15.9 10*3/uL — ABNORMAL HIGH (ref 4.0–10.5)
nRBC: 0.3 % — ABNORMAL HIGH (ref 0.0–0.2)

## 2022-05-09 LAB — BASIC METABOLIC PANEL
Anion gap: 5 (ref 5–15)
BUN: 9 mg/dL (ref 6–20)
CO2: 25 mmol/L (ref 22–32)
Calcium: 8.3 mg/dL — ABNORMAL LOW (ref 8.9–10.3)
Chloride: 104 mmol/L (ref 98–111)
Creatinine, Ser: 0.67 mg/dL (ref 0.44–1.00)
GFR, Estimated: >60 mL/min (ref >60)
Glucose, Bld: 95 mg/dL (ref 70–99)
Potassium: 3.3 mmol/L — ABNORMAL LOW (ref 3.5–5.1)
Sodium: 134 mmol/L — ABNORMAL LOW (ref 135–145)

## 2022-05-09 MED ORDER — CEPHALEXIN 500 MG PO CAPS: 500.0000 mg | ORAL_CAPSULE | Freq: Three times a day (TID) | ORAL | 0 refills | Status: AC

## 2022-05-09 MED ORDER — ACETAMINOPHEN 500 MG PO TABS
1000.0000 mg | ORAL_TABLET | Freq: Once | ORAL | Status: AC
  Administered 2022-05-09: 1000 mg via ORAL
  Filled 2022-05-09: qty 2

## 2022-05-09 MED ORDER — CEPHALEXIN 500 MG PO CAPS
500.0000 mg | ORAL_CAPSULE | Freq: Three times a day (TID) | ORAL | 0 refills | Status: DC
  Filled 2022-05-09: qty 21, 7d supply, fill #0

## 2022-05-09 MED ORDER — ENOXAPARIN SODIUM 80 MG/0.8ML IJ SOSY
1.0000 mg/kg | PREFILLED_SYRINGE | Freq: Once | INTRAMUSCULAR | Status: AC
  Administered 2022-05-09: 70 mg via SUBCUTANEOUS
  Filled 2022-05-09: qty 0.7

## 2022-05-09 MED ORDER — CEPHALEXIN 250 MG PO CAPS
500.0000 mg | ORAL_CAPSULE | Freq: Once | ORAL | Status: AC
  Administered 2022-05-09: 500 mg via ORAL
  Filled 2022-05-09: qty 2

## 2022-05-09 NOTE — ED Provider Notes (Signed)
[facility] Provider Note  CSN: [id number] Arrival date & time: 05/08/22 2128  Chief Complaint(s) Arm Swelling  HPI Sierra Daniels is a 27 y.o. G2 P2-0-0-3 female at 3 days postpartum status post C-section who presents to the emergency department for arm swelling and pain.  Patient was discharged earlier in the day and reported that her IV had infiltrated.  Swelling gradually worsened but she developed worsening pain especially with range of motion.  She denied any fall or trauma.  Admitted to a history of cocaine use but denied any IV drug use.  No other physical complaints  Patient was actually evaluated at the MAU and sent to rule out DVT versus compartment syndrome.   The history is provided by the patient.    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 Authorizing Provider  cephALEXin (KEFLEX) 500 MG capsule Take 1 capsule (500 mg total) by mouth 3 (three) times daily for 7 days. 05/09/22 05/16/22  Fatima Blank, MD  furosemide (LASIX) 20 MG tablet Take 1 tablet (20 mg total) by mouth daily. 05/08/22   Chancy Milroy, MD  ibuprofen (ADVIL) 600 MG tablet Take 1 tablet (600 mg total) by mouth every 6 (six) hours. 05/08/22   Chancy Milroy, MD  oxyCODONE (OXY IR/ROXICODONE) 5 MG immediate release tablet Take 1 tablet (5 mg total) by mouth every 4 (four) hours as needed for moderate pain. 05/08/22   Chancy Milroy, MD  Prenatal Vit-Fe Fumarate-FA (PRENATAL MULTIVITAMIN) TABS tablet Take 1 tablet by mouth daily at 12 noon.    [provider]                                                                                                                                    Allergies Patient has no known allergies.  Review of Systems Review of Systems As noted in HPI  Physical Exam Vital Signs  I have reviewed the triage vital signs BP 116/74   Pulse 80   Temp 98.5 F (36.9 C) (Oral)   Resp 14   Ht '5\' 7"'$  (1.702  m)   Wt 70.5 kg   LMP 08/19/2021 (Approximate)   SpO2 98%   BMI 24.34 kg/m   Physical Exam Vitals reviewed.  Constitutional:      General: She is not in acute distress.    Appearance: She is well-developed. She is not diaphoretic.  HENT:     Head: Normocephalic and atraumatic.     Right Ear: External ear normal.     Left Ear: External ear normal.     Nose: Nose normal.  Eyes:     General: No scleral icterus.    Conjunctiva/sclera: Conjunctivae normal.  Neck:     Trachea: Phonation normal.  Cardiovascular:     Rate and Rhythm: Normal rate and regular rhythm.  Pulmonary:     Effort: Pulmonary effort is normal. No respiratory distress.     Breath sounds: No stridor.  Abdominal:     General: There is no distension.  Musculoskeletal:        General: Normal range of motion.     Right upper arm: Swelling, edema and tenderness present.     Right hand: Normal strength. Normal sensation. Normal pulse.     Left hand: Normal strength. Normal sensation. Normal pulse.       Arms:     Cervical back: Normal range of motion.  Neurological:     Mental Status: She is alert  and oriented to person, place, and time.  Psychiatric:        Behavior: Behavior normal.     ED Results and Treatments Labs (all labs ordered are listed, but only abnormal results are displayed) Labs Reviewed  BASIC METABOLIC PANEL - Abnormal; Notable for the following components:      Result Value   Sodium 134 (*)    Potassium 3.3 (*)    Calcium 8.3 (*)    All other components within normal limits  CBC WITH DIFFERENTIAL/PLATELET - Abnormal; Notable for the following components:   WBC 15.9 (*)    RBC 3.17 (*)    Hemoglobin 7.9 (*)    HCT 24.9 (*)    MCV 78.5 (*)    MCH 24.9 (*)    nRBC 0.3 (*)    Neutro Abs 12.3 (*)    Monocytes Absolute 1.2 (*)    Abs Immature Granulocytes 0.18 (*)    All other components within normal limits                                                                                                                         EKG  EKG Interpretation  Date/Time:    Ventricular Rate:    PR Interval:    QRS Duration:   QT Interval:    QTC Calculation:   R Axis:     Text Interpretation:         Radiology No results found.  Medications Ordered in ED Medications  ibuprofen (ADVIL) tablet 800 mg (800 mg Oral Given 05/08/22 2224)  acetaminophen (TYLENOL) tablet 1,000  mg (1,000 mg Oral Given 05/09/22 0103)  cephALEXin (KEFLEX) capsule 500 mg (500 mg Oral Given 05/09/22 0253)  enoxaparin (LOVENOX) injection 70 mg (70 mg Subcutaneous Given 05/09/22 0253)                                                                                                                                     Procedures Ultrasound ED DVT  Date/Time: 05/09/2022 3:44 AM  Performed by: Fatima Blank, MD Authorized by: Fatima Blank, MD   Procedure details:    Indications: upper extremity pain     Assessment for:  DVT   Images Archived: Yes     Limitations:  None RUE Findings:    Right brachial vein: not compressible   IMPRESSION:   DVT:     R brachial  vein Comments:     Likely thrombus in brachial vein about he AC fossa as well as thrombus in distal basilic vein.   (including critical care time)  Medical Decision Making / ED Course   Medical Decision Making Amount and/or Complexity of Data Reviewed Labs: ordered. Decision-making details documented in ED Course.  Risk OTC drugs. Prescription drug management.    Right arm swelling Exam is not concerning for compartment syndrome. It does appear to be consistent with superficial thrombophlebitis Bedside ultrasound also notable for likely clot within the distal basilic vein. CBC ordered and patient does have leukocytosis which may be from recent C-section versus possible infection.  Hemoglobin is stable.  Metabolic panel without any renal insufficiency.  Will treat patient for possible cellulitis with Keflex.  Will provide a single treatment dose dose of Lovenox and have patient return for formal ultrasound to assess for DVT. I discussed the case with GYN and spoke with Dr. Kennon Rounds who stated patient can be prescribed Lovenox if needed even with breast-feeding.       Final Clinical Impression(s) / ED Diagnoses Final diagnoses:  Superficial thrombophlebitis of right upper extremity   The patient appears reasonably screened and/or stabilized for discharge and I doubt any other medical condition or other [facility] requiring further screening, evaluation, or treatment in the ED at this time. I have discussed the findings, Dx and Tx plan with the patient/family who expressed understanding and agree(s) with the plan. Discharge instructions discussed at length. The patient/family was given strict return precautions who verbalized understanding of the instructions. No further questions at time of discharge.  Disposition: Discharge  Condition: Good  ED Discharge Orders          Ordered    UE VENOUS DUPLEX        05/09/22 0247    cephALEXin (KEFLEX) 500 MG capsule  3 times daily,   Status:   Discontinued        05/09/22 0333    cephALEXin (KEFLEX) 500 MG capsule  3 times daily        05/09/22 0344  Follow Up: [facility] Emergency Department at [facility] [address] [id number] [place] [phone] Today for ultrasound           This chart was dictated using voice recognition software.  Despite best efforts to proofread,  errors can occur which can change the documentation meaning.    Fatima Blank, MD 05/09/22 [phone]

## 2022-05-09 NOTE — ED Triage Notes (Signed)
Pt reports right arm swelling that started when she got home from hospital today around 1300. She has been asleep. She states she thinks IV infiltrated. Swelling visible to R elbow and forearm.

## 2022-05-10 ENCOUNTER — Ambulatory Visit (HOSPITAL_COMMUNITY)
Admission: RE | Admit: 2022-05-10 | Discharge: 2022-05-10 | Disposition: A | Discharge status: Home / Self Care | Payer: Medicaid Other | Source: Ambulatory Visit | Attending: Emergency Medicine | Admitting: Emergency Medicine

## 2022-05-10 ENCOUNTER — Other Ambulatory Visit: Payer: Self-pay

## 2022-05-10 ENCOUNTER — Ambulatory Visit: Payer: Medicaid Other

## 2022-05-10 ENCOUNTER — Other Ambulatory Visit (HOSPITAL_COMMUNITY): Payer: Self-pay | Admitting: Emergency Medicine

## 2022-05-10 DIAGNOSIS — R609 Edema, unspecified: Secondary | ICD-10-CM | POA: Diagnosis present

## 2022-05-10 LAB — PANORAMA PRENATAL TEST FULL PANEL:PANORAMA TEST PLUS 5 ADDITIONAL MICRODELETIONS

## 2022-05-10 LAB — SURGICAL PATHOLOGY

## 2022-05-10 NOTE — Progress Notes (Signed)
Upper extremity venous duplex has been completed.   Preliminary results in CV Proc.   Jamesetta Greenhalgh Chandria Rookstool 05/10/2022 1:17 PM

## 2022-05-10 NOTE — Progress Notes (Signed)
Upper extremity venous duplex has been completed.   Preliminary results in CV Proc.   Jinny Blossom Ellin Fitzgibbons 05/10/2022 1:13 PM

## 2022-05-12 ENCOUNTER — Encounter: Payer: Self-pay | Admitting: Obstetrics & Gynecology

## 2022-05-12 LAB — CYTOLOGY - PAP: Pap: NEGATIVE

## 2022-05-13 ENCOUNTER — Other Ambulatory Visit: Payer: Self-pay | Admitting: *Deleted

## 2022-05-13 DIAGNOSIS — O24429 Gestational diabetes mellitus in childbirth, unspecified control: Secondary | ICD-10-CM

## 2022-05-14 ENCOUNTER — Other Ambulatory Visit: Payer: Medicaid Other

## 2022-05-14 ENCOUNTER — Ambulatory Visit: Payer: Medicaid Other

## 2022-05-15 ENCOUNTER — Telehealth (HOSPITAL_COMMUNITY): Payer: Self-pay

## 2022-05-15 NOTE — Telephone Encounter (Signed)
Patient reports that she is doing fine. She states that her dressing is off of her incision and her incision is doing well. RN reviewed incision care and signs of infection to report to the provider. Patient declines any other questions/concerns about her health and healing.  Baby's are currently inpatient in the [facility] NICU.  EPDS score is 3.  Sharyn Lull [facility] 05/15/22,1430

## 2022-05-17 NOTE — Progress Notes (Signed)
Alert received through NCNotify system as follows: Patient identified as not having a recommended prenatal visit. Patient is 38 weeks and 2 days pregnant. Most recent prenatal visit was on 04/29/2022 at [facility].  Chart review: Patient's last visit was on 05/04/2022. Pt delivered on 05/05/2022. Postpartum visit scheduled for 06/11/2022.  No action needed.  Martina Sinner, RN 05/17/2022  11:25 AM

## 2022-05-18 ENCOUNTER — Ambulatory Visit: Payer: Medicaid Other

## 2022-05-19 ENCOUNTER — Encounter: Payer: Self-pay | Admitting: Obstetrics & Gynecology

## 2022-05-25 ENCOUNTER — Encounter: Payer: Self-pay | Admitting: Family Medicine

## 2022-05-26 ENCOUNTER — Other Ambulatory Visit: Payer: Medicaid Other

## 2022-05-26 NOTE — BH Specialist Note (Signed)
Integrated Behavioral Health via Telemedicine Visit  06/09/2022 Sierra Daniels [id number]  Number of [facility] Clinician visits: 1- Initial Visit  Session Start time: [id number]   Session End time: [id number]  Total time in minutes: 29   Referring Provider: Lynnda Shields, MD Patient/Family location: Home [facility]  All persons participating in visit: Patient Sierra Daniels and [place]   Types of Service: Individual psychotherapy and Video visit  I connected with Bobette Robak and/or Clois Brandes's  n/a  via  Telephone or Video Enabled Telemedicine Application  (Video is Caregility application) and verified that I am speaking with the correct person using two identifiers. Discussed confidentiality: Yes   I discussed the limitations of telemedicine and the availability of in person appointments.  Discussed there is a possibility of technology failure and discussed alternative modes of communication if that failure occurs.  I discussed that engaging in this telemedicine visit, they consent to the provision of behavioral healthcare and the services will be billed under their insurance.  Patient and/or legal guardian expressed understanding and consented to Telemedicine visit: Yes   Presenting Concerns: Patient and/or family reports the following symptoms/concerns: Fatigue; mild decreased interest and mild irritability; pt sleeps up to two hours at a time, due to twin babies' sleep schedule, feels supported at home; concern about cesarean scar feeling tight, but not painful, with lump forming under the incision. No other concerns at this time. Duration of problem: Postpartum; Severity of problem: mild  Patient and/or Family's Strengths/Protective Factors: Social connections, Sense of purpose, and Physical Health (exercise, healthy diet, medication compliance, etc.)  Goals Addressed: Patient will:    Increase knowledge and/or ability of: healthy habits   Demonstrate ability to: Increase healthy adjustment to current life circumstances  Progress towards Goals: Ongoing  Interventions: Interventions utilized:  Motivational Interviewing, Functional Assessment of ADLs, Psychoeducation and/or Health Education, and Link to [employer] Standardized Assessments completed: GAD-7 and PHQ 9  Patient and/or Family Response: Patient agrees with treatment plan.   Assessment: Patient currently experiencing At risk for depression postpartum.   Patient may benefit from psychoeducation and brief therapeutic interventions regarding coping with current life adjustment. .  Plan: Follow up with behavioral health clinician on : Two weeks Behavioral recommendations:  -Continue prioritizing healthy self-care (regular meals, adequate rest; allowing practical help from supportive friends and family) until at least postpartum medical appointment -Consider new mom support group as needed at either www.postpartum.net or www.conehealthybaby.com  -Discuss concern with cesarean scar at upcoming medical visit 06/11/22 -Accept [place] referral (make sure voicemail is set up and not full to receive [facility] call)  Referral(s): [facility] (In Clinic) and Community Resources:  [facility]  I discussed the assessment and treatment plan with the patient and/or parent/guardian. They were provided an opportunity to ask questions and all were answered. They agreed with the plan and demonstrated an understanding of the instructions.   They were advised to call back or seek an in-person evaluation if the symptoms worsen or if the condition fails to improve as anticipated.  [place], LCSW     06/09/2022    2:31 PM 04/26/2022    5:21 PM 01/24/2018    9:47 AM 01/18/2018    2:05 PM 01/10/2018    2:15 PM  Depression screen PHQ 2/9  Decreased Interest 1 0 '1 1 1  '$ Down, Depressed, Hopeless 0 0 0 0 0   PHQ - 2  Score 1 0 '1 1 1  '$ Altered sleeping 0 0 '1 1 1  '$ Tired, decreased energy '1 2 1 1 1  '$ Change in appetite 0 0 1 1 0  Feeling bad or failure about yourself  0 0 0 0 0  Trouble concentrating 0 0 0 1 0  Moving slowly or fidgety/restless 0 0 0 0 0  Suicidal thoughts 0 0 0 0 0  PHQ-9 Score '2 2 4 5 3      '$ 06/09/2022    2:33 PM 04/26/2022    5:21 PM 01/24/2018    9:47 AM 01/18/2018    2:05 PM  GAD 7 : Generalized Anxiety Score  Nervous, Anxious, on Edge 0 0 0 0  Control/stop worrying 0 0 1 1  Worry too much - different things 0 0 1 1  Trouble relaxing 0 0 1 1  Restless 0 0 1 1  Easily annoyed or irritable '1 2 1 1  '$ Afraid - awful might happen 0 0 0 0  Total GAD 7 Score '1 2 5 '$ 5

## 2022-05-31 ENCOUNTER — Encounter: Payer: Self-pay | Admitting: Obstetrics & Gynecology

## 2022-05-31 ENCOUNTER — Other Ambulatory Visit: Payer: Self-pay

## 2022-06-09 ENCOUNTER — Ambulatory Visit (INDEPENDENT_AMBULATORY_CARE_PROVIDER_SITE_OTHER): Payer: Medicaid Other | Admitting: Clinical

## 2022-06-09 DIAGNOSIS — Z9189 Other specified personal risk factors, not elsewhere classified: Secondary | ICD-10-CM | POA: Diagnosis not present

## 2022-06-09 NOTE — Patient Instructions (Signed)
[facility] for [facility] at [facility] for Women [place], [place] [address] [phone] (main office) [phone] [facility] office)  New Parent Support Groups www.postpartum.net www.conehealthybaby.com  Armed forces training and education officer  (Childcare options, Early childcare development, etc.) [employer]  [place]  [email or web address]

## 2022-06-11 ENCOUNTER — Ambulatory Visit: Payer: Medicaid Other | Admitting: Family Medicine

## 2022-06-11 NOTE — BH Specialist Note (Signed)
Pt did not arrive to video visit and did not answer the phone; Left HIPPA-compliant message to call back Dhairya Corales from [facility] at  [phone] (Surabhi Gadea's office).  ?; left MyChart message for patient.  ? ?

## 2022-06-25 ENCOUNTER — Ambulatory Visit: Payer: Medicaid Other | Admitting: Clinical

## 2022-06-25 DIAGNOSIS — Z91199 Patient's noncompliance with other medical treatment and regimen due to unspecified reason: Secondary | ICD-10-CM

## 2024-01-30 ENCOUNTER — Emergency Department (HOSPITAL_COMMUNITY)
Admission: EM | Admit: 2024-01-30 | Discharge: 2024-01-30 | Disposition: A | Discharge status: Home / Self Care | Attending: Emergency Medicine | Admitting: Emergency Medicine

## 2024-01-30 ENCOUNTER — Encounter (HOSPITAL_COMMUNITY): Payer: Self-pay

## 2024-01-30 ENCOUNTER — Other Ambulatory Visit: Payer: Self-pay

## 2024-01-30 ENCOUNTER — Emergency Department (HOSPITAL_COMMUNITY)

## 2024-01-30 DIAGNOSIS — R42 Dizziness and giddiness: Secondary | ICD-10-CM | POA: Diagnosis present

## 2024-01-30 DIAGNOSIS — A5901 Trichomonal vulvovaginitis: Secondary | ICD-10-CM | POA: Diagnosis not present

## 2024-01-30 DIAGNOSIS — A64 Unspecified sexually transmitted disease: Secondary | ICD-10-CM | POA: Diagnosis not present

## 2024-01-30 DIAGNOSIS — R739 Hyperglycemia, unspecified: Secondary | ICD-10-CM | POA: Diagnosis not present

## 2024-01-30 LAB — WET PREP, GENITAL
Clue Cells Wet Prep HPF POC: NONE SEEN
Sperm: NONE SEEN
WBC, Wet Prep HPF POC: <10 (ref <10)
Yeast Wet Prep HPF POC: NONE SEEN

## 2024-01-30 LAB — BASIC METABOLIC PANEL WITH GFR
Anion gap: 11 (ref 5–15)
BUN: 8 mg/dL (ref 6–20)
CO2: 23 mmol/L (ref 22–32)
Calcium: 8.7 mg/dL — ABNORMAL LOW (ref 8.9–10.3)
Chloride: 103 mmol/L (ref 98–111)
Creatinine, Ser: 1 mg/dL (ref 0.44–1.00)
GFR, Estimated: >60 mL/min (ref >60)
Glucose, Bld: 109 mg/dL — ABNORMAL HIGH (ref 70–99)
Potassium: 3.6 mmol/L (ref 3.5–5.1)
Sodium: 137 mmol/L (ref 135–145)

## 2024-01-30 LAB — CBC
HCT: 38.5 % (ref 36.0–46.0)
Hemoglobin: 12.7 g/dL (ref 12.0–15.0)
MCH: 29.2 pg (ref 26.0–34.0)
MCHC: 33 g/dL (ref 30.0–36.0)
MCV: 88.5 fL (ref 80.0–100.0)
Platelets: 205 K/uL (ref 150–400)
RBC: 4.35 MIL/uL (ref 3.87–5.11)
RDW: 13.4 % (ref 11.5–15.5)
WBC: 5.7 K/uL (ref 4.0–10.5)
nRBC: 0 % (ref 0.0–0.2)

## 2024-01-30 LAB — CBG MONITORING, ED: Glucose-Capillary: 168 mg/dL — ABNORMAL HIGH (ref 70–99)

## 2024-01-30 LAB — D-DIMER, QUANTITATIVE: D-Dimer, Quant: <0.27 ug{FEU}/mL (ref 0.00–0.50)

## 2024-01-30 LAB — TROPONIN I (HIGH SENSITIVITY): Troponin I (High Sensitivity): 3 ng/L (ref <18)

## 2024-01-30 MED ORDER — DOXYCYCLINE HYCLATE 100 MG PO CAPS: 100.0000 mg | ORAL_CAPSULE | Freq: Two times a day (BID) | ORAL | 0 refills | Status: DC

## 2024-01-30 MED ORDER — DOXYCYCLINE HYCLATE 100 MG PO TABS
100.0000 mg | ORAL_TABLET | Freq: Once | ORAL | Status: AC
  Administered 2024-01-30: 100 mg via ORAL
  Filled 2024-01-30: qty 1

## 2024-01-30 MED ORDER — METRONIDAZOLE 500 MG PO TABS
500.0000 mg | ORAL_TABLET | Freq: Once | ORAL | Status: AC
  Administered 2024-01-30: 500 mg via ORAL
  Filled 2024-01-30: qty 1

## 2024-01-30 MED ORDER — DOXYCYCLINE HYCLATE 100 MG PO CAPS: 100.0000 mg | ORAL_CAPSULE | Freq: Two times a day (BID) | ORAL | 0 refills | Status: AC

## 2024-01-30 MED ORDER — LIDOCAINE HCL (PF) 1 % IJ SOLN
INTRAMUSCULAR | Status: AC
  Filled 2024-01-30: qty 5

## 2024-01-30 MED ORDER — CEFTRIAXONE SODIUM 500 MG IJ SOLR
500.0000 mg | Freq: Once | INTRAMUSCULAR | Status: AC
  Administered 2024-01-30: 500 mg via INTRAMUSCULAR
  Filled 2024-01-30: qty 500

## 2024-01-30 MED ORDER — METRONIDAZOLE 500 MG PO TABS: 500.0000 mg | ORAL_TABLET | Freq: Two times a day (BID) | ORAL | 0 refills | Status: DC

## 2024-01-30 NOTE — ED Notes (Signed)
 No answer for triage.

## 2024-01-30 NOTE — Discharge Instructions (Addendum)
 You are seen today for headache, having blood streaking in vomit.  With no recurrent symptoms and having history of drink alcohol as well as vomiting, and with reassuring physical exam, lab work and imaging low suspicion for any emergent causes recent today.  Your lab work did note that you were positive for trichomonas at this time which requires Flagyl , which we are also empirically treating you for other sexually transmitted infections at this time.  Your sexual partner will also have to be treated for trichomonas, have him go to PCP for Flagyl .  Return to the ER for any new or worsening symptoms include uncontrolled abdominal pain, persistent nausea vomiting, fever, persistent vaginal discharge or bleeding, chest pain, shortness breath, one-sided weakness, acute vision changes.

## 2024-01-30 NOTE — ED Triage Notes (Signed)
 Patient reports that she awoke this am with dizziness and reports some hemoptysis on awakening. Reports that her blood sugar is fine. Ambulatory with steady gait, alert and oriented

## 2024-01-30 NOTE — ED Provider Notes (Signed)
 [place] EMERGENCY DEPARTMENT AT [facility] Provider Note   CSN: [id number] Arrival date & time: 01/30/24  1059     Patient presents with: No chief complaint on file.   Sierra Daniels is a 28 y.o. female.  HPI Patient is a 28 year old female who is in the ED today for concerns for waking up feeling dizzy, headache (noted to be pulsatile, posterior) accompanied with light sensitivity, sound sensitivity as well as aura, which she contributed be secondary to her blood sugar being elevated.  Reported that she had not had much to drink today.  Reports that she also had 1 episode of blood streaking in vomit earlier today.  Having reported a night of drinking alcohol.  Since has not had any repeat episodes.  Notably headache and vomiting have improved with no recurrence of symptoms.  No longer feels dizzy.  Notes that she also has had some vaginal discomfort with frequent intercourse with fianc is wishing to be swabbed at this time.    Medical history of Herpes, syphilis, depression, gestational diabetes  Denies fever, headache, blurry vision, vertigo, tinnitus, otalgia, dysphagia, odynophagia, chest pain, shortness of breath, abdominal pain, diarrhea, melena, hematochezia, vaginal discharge, vaginal bleeding, lower leg swelling, rashes.    Prior to Admission medications   Medication Sig Start Date End Date Taking? Authorizing Provider  acetaminophen  (TYLENOL ) 500 MG tablet Take 500 mg by mouth every 6 (six) hours as needed for mild pain (pain score 1-3).   Yes [provider]  metroNIDAZOLE  (FLAGYL ) 500 MG tablet Take 1 tablet (500 mg total) by mouth 2 (two) times daily. 01/30/24  Yes Beola Terrall RAMAN, PA-C  doxycycline (VIBRAMYCIN) 100 MG capsule Take 1 capsule (100 mg total) by mouth 2 (two) times daily for 7 days. 01/30/24 02/06/24  Bracen Schum S, PA-C    Allergies: Patient has no known allergies.    Review of Systems  Gastrointestinal:  Positive for nausea  and vomiting.  Genitourinary:  Positive for vaginal pain.  Neurological:  Positive for headaches.  All other systems reviewed and are negative.   Updated Vital Signs BP 116/76   Pulse (!) 56   Temp 98.6 F (37 C) (Oral)   Resp 18   SpO2 99%   Physical Exam Vitals and nursing note reviewed.  Constitutional:      General: She is not in acute distress.    Appearance: Normal appearance. She is not ill-appearing or diaphoretic.  HENT:     Head: Normocephalic and atraumatic.     Mouth/Throat:     Mouth: Mucous membranes are moist.     Pharynx: Oropharynx is clear. No oropharyngeal exudate or posterior oropharyngeal erythema.  Eyes:     General: No scleral icterus.       Right eye: No discharge.        Left eye: No discharge.     Extraocular Movements: Extraocular movements intact.     Conjunctiva/sclera: Conjunctivae normal.     Pupils: Pupils are equal, round, and reactive to light.  Cardiovascular:     Rate and Rhythm: Normal rate and regular rhythm.     Pulses: Normal pulses.     Heart sounds: Normal heart sounds. No murmur heard.    No friction rub. No gallop.  Pulmonary:     Effort: Pulmonary effort is normal. No respiratory distress.     Breath sounds: No stridor. No wheezing, rhonchi or rales.  Chest:     Chest wall: No tenderness.  Abdominal:  General: Abdomen is flat. There is no distension.     Palpations: Abdomen is soft.     Tenderness: There is no abdominal tenderness. There is no right CVA tenderness, left CVA tenderness, guarding or rebound.  Musculoskeletal:        General: No swelling, deformity or signs of injury.     Cervical back: Normal range of motion. No rigidity.     Right lower leg: No edema.     Left lower leg: No edema.  Skin:    General: Skin is warm and dry.     Findings: No bruising, erythema or lesion.  Neurological:     General: No focal deficit present.     Mental Status: She is alert and oriented to person, place, and time. Mental  status is at baseline.     Sensory: No sensory deficit.     Motor: No weakness.     Comments: No facial asymmetry, no ataxia, no apraxia, no aphasia, no arm drift, normal coordination with finger-to-nose, normal sensation to both upper and lower extremities bilaterally, normal grip strength bilaterally, normal strength to both flexion and extension to both upper lower extremities 5+ bilaterally, no visual field deficits, no nystagmus.   Psychiatric:        Mood and Affect: Mood normal.     (all labs ordered are listed, but only abnormal results are displayed) Labs Reviewed  WET PREP, GENITAL - Abnormal; Notable for the following components:      Result Value   Trich, Wet Prep PRESENT (*)    All other components within normal limits  BASIC METABOLIC PANEL WITH GFR - Abnormal; Notable for the following components:   Glucose, Bld 109 (*)    Calcium 8.7 (*)    All other components within normal limits  CBG MONITORING, ED - Abnormal; Notable for the following components:   Glucose-Capillary 168 (*)    All other components within normal limits  CBC  D-DIMER, QUANTITATIVE  GC/CHLAMYDIA PROBE AMP ([place]) NOT AT [facility]  TROPONIN I (HIGH SENSITIVITY)    EKG: EKG Interpretation Date/Time:  Monday January 30 2024 12:38:21 EDT Ventricular Rate:  52 PR Interval:  152 QRS Duration:  82 QT Interval:  470 QTC Calculation: 437 R Axis:   112  Text Interpretation: Sinus bradycardia Right axis deviation Nonspecific ST abnormality Abnormal QRS-T angle, consider primary T wave abnormality No previous EKGs for comparison Confirmed by Gennaro Bouchard ([id number]) on 01/30/2024 12:42:10 PM  Radiology: DG Chest 1 View Result Date: 01/30/2024 EXAM: 1 VIEW(S) XRAY OF THE CHEST 01/30/2024 12:50:00 PM COMPARISON: None available. CLINICAL HISTORY: chest pain FINDINGS: LUNGS AND PLEURA: No focal pulmonary opacity. No pulmonary edema. No pleural effusion. No pneumothorax. HEART AND MEDIASTINUM: No acute  abnormality of the cardiac and mediastinal silhouettes. BONES AND SOFT TISSUES: No acute osseous abnormality. Mild scoliosis. IMPRESSION: 1. No acute cardiopulmonary process identified. Electronically signed by: Waddell Calk MD 01/30/2024 02:35 PM EDT RP Workstation: [id number]   Procedures   Medications Ordered in the ED  metroNIDAZOLE  (FLAGYL ) tablet 500 mg (has no administration in time range)  cefTRIAXone (ROCEPHIN) injection 500 mg (has no administration in time range)  doxycycline (VIBRA-TABS) tablet 100 mg (has no administration in time range)     Medical Decision Making Risk Prescription drug management.   This patient is a 28 year old female who presents to the ED for concern of waking up with headache as well as noting an episode of hematemesis with blood streaking and vomit after having  negative drinking.  Reports that additionally she is also concerned for some vaginal discomfort she is not having without any discharge or bleeding.  On physical exam, patient is in no acute distress, afebrile, alert and orient x 4, speaking in full sentences, nontachypneic, nontachycardic.  LCTAB, RRR, no murmur, no abdominal tenderness with patient, normal neuroexam, no CVA tenderness.  Patient declined pelvic examination.  Wishing to self swab.  Lab work was unremarkable and with normal neuroexam and history of drinking, low suspicion for PUD, Boerhaave's, suspecting Mallory-Weiss tear as cause of patient's symptoms reporting vomiting.  No recurrence of vomiting in the ER today.  And with normal neuroexam and no recurrence of symptoms, low suspicion for CVA at this time.  Patient has history of tubal ligation, declining pregnancy test at this time.  Wet prep did come back positive for trichomonas, we will treat as well as empirically treat with doxycycline and Rocephin for GC with GC pending at this time.  Told her to have partner also go to get treated.  Low suspicion for PID, TOA.  Provided  precautions when taking antibiotics.  Patient vital signs have remained stable throughout the course of patient's time in the ED. Low suspicion for any other emergent pathology at this time. I believe this patient is safe to be discharged. Provided strict return to ER precautions. Patient expressed agreement and understanding of plan. All questions were answered.  Differential diagnoses prior to evaluation: The emergent differential diagnosis includes, but is not limited to,  Tension headache, migraine, polypharmacy, substance abuse, sinusitis, cervicogenic headache, dehydration, cluster headache, trigeminal neuralgia, IIH, PRES syndrome, intracranial bleed, CVA, GERD, PUD, Mallory-Weiss tears, Boerhaave's, dehydration, tablet disturbance . This is not an exhaustive differential.   Past Medical History / Co-morbidities / Social History: Herpes, syphilis, depression, gestational diabetes  Additional history: Chart reviewed. Pertinent results include:    Resting in the emergency department on 05/08/2022 for superficial thrombophlebitis right upper extremity, did not show up for ultrasound.  Lab Tests/Imaging studies: I personally interpreted labs/imaging and the pertinent results include:   CBC unremarkable BMP notes elevated blood sugar 109 but otherwise unremarkable D-dimer unremarkable Troponin negative with patient refusing second troponin due to not wishing to be stuck again    Chest x-ray unremarkable I agree with the radiologist interpretation.  Cardiac monitoring: EKG obtained and interpreted by myself and attending physician which shows: Sinus bradycardia  EKG Interpretation Date/Time:  Monday January 30 2024 12:38:21 EDT Ventricular Rate:  52 PR Interval:  152 QRS Duration:  82 QT Interval:  470 QTC Calculation: 437 R Axis:   112  Text Interpretation: Sinus bradycardia Right axis deviation Nonspecific ST abnormality Abnormal QRS-T angle, consider primary T wave  abnormality No previous EKGs for comparison Confirmed by Gennaro Bouchard ([id number]) on 01/30/2024 12:42:10 PM          Medications: I ordered medication including Flagyl , doxycycline, Rocephin.  I have reviewed the patients home medicines and have made adjustments as needed.  Critical Interventions: None  Social Determinants of Health: Currently does not have PCP at this time.  Disposition: After consideration of the diagnostic results and the patients response to treatment, I feel that the patient would benefit from discharge and shortness of breath.   emergency department workup does not suggest an emergent condition requiring admission or immediate intervention beyond what has been performed at this time. The plan is: Antibiotic regimen for STD, follow-up with PCP as needed, return to the ER for new or worsening symptoms. The  patient is safe for discharge and has been instructed to return immediately for worsening symptoms, change in symptoms or any other concerns.   Final diagnoses:  Trichomonas vaginalis (TV) infection  STD (female)    ED Discharge Orders          Ordered    doxycycline (VIBRAMYCIN) 100 MG capsule  2 times daily,   Status:  Discontinued        01/30/24 1850    metroNIDAZOLE  (FLAGYL ) 500 MG tablet  2 times daily        01/30/24 1850    doxycycline (VIBRAMYCIN) 100 MG capsule  2 times daily        01/30/24 1851               Beola Terrall RAMAN, PA-C 01/30/24 1851    Yolande Lamar BROCKS, MD 01/31/24 1401

## 2024-01-30 NOTE — ED Provider Triage Note (Cosign Needed Addendum)
 Emergency Medicine Provider Triage Evaluation Note  Sierra Daniels , a 28 y.o. female  was evaluated in triage.  Pt complains of cp. Woke up this AM with pain to chest radiates to arm and back.  Endorse dizzy, cough and cough up a small amount of blood.  Pt is on birth control.  Hx of tobacco use  Review of Systems  Positive: As above Negative: As above  Physical Exam  BP 108/71 (BP Location: Right Arm)   Pulse 70   Temp 98.3 F (36.8 C)   Resp 14   SpO2 99%  Gen:   Awake, no distress   Resp:  Normal effort  MSK:   Moves extremities without difficulty  Other:    Medical Decision Making  Medically screening exam initiated at 12:12 PM.  Appropriate orders placed.  Sierra Daniels was informed that the remainder of the evaluation will be completed by another provider, this initial triage assessment does not replace that evaluation, and the importance of remaining in the ED until their evaluation is complete.     Nivia Colon, PA-C 01/30/24 1214    Nivia Colon, PA-C 01/30/24 1225

## 2024-01-31 LAB — GC/CHLAMYDIA PROBE AMP (~~LOC~~) NOT AT ARMC
Chlamydia: NEGATIVE
Comment: NEGATIVE
Comment: NORMAL
Neisseria Gonorrhea: NEGATIVE

## 2024-04-18 ENCOUNTER — Ambulatory Visit (HOSPITAL_COMMUNITY)
Admission: EM | Admit: 2024-04-18 | Discharge: 2024-04-18 | Disposition: A | Discharge status: Home / Self Care | Attending: Family Medicine | Admitting: Family Medicine

## 2024-04-18 ENCOUNTER — Other Ambulatory Visit: Payer: Self-pay

## 2024-04-18 ENCOUNTER — Encounter (HOSPITAL_COMMUNITY): Payer: Self-pay | Admitting: Emergency Medicine

## 2024-04-18 DIAGNOSIS — R112 Nausea with vomiting, unspecified: Secondary | ICD-10-CM

## 2024-04-18 DIAGNOSIS — K529 Noninfective gastroenteritis and colitis, unspecified: Secondary | ICD-10-CM | POA: Diagnosis not present

## 2024-04-18 LAB — POCT URINE DIPSTICK
Bilirubin, UA: NEGATIVE
Glucose, UA: NEGATIVE mg/dL
Ketones, POC UA: NEGATIVE mg/dL
Nitrite, UA: NEGATIVE
POC PROTEIN,UA: NEGATIVE
Spec Grav, UA: 1.025
Urobilinogen, UA: 0.2 U/dL
pH, UA: 5.5

## 2024-04-18 LAB — GLUCOSE, POCT (MANUAL RESULT ENTRY): POCT Glucose (KUC): 147 mg/dL — AB (ref 70–99)

## 2024-04-18 LAB — POCT URINE PREGNANCY: Preg Test, Ur: NEGATIVE

## 2024-04-18 MED ORDER — ONDANSETRON 4 MG PO TBDP: 4.0000 mg | ORAL_TABLET | Freq: Three times a day (TID) | ORAL | 0 refills | Status: AC | PRN

## 2024-04-18 NOTE — ED Notes (Signed)
 Given water to provide urine specimen

## 2024-04-18 NOTE — Discharge Instructions (Signed)
Please do your best to ensure adequate fluid intake in order to avoid dehydration. If you find that you are unable to tolerate drinking fluids regularly please proceed to the Emergency Department for evaluation. ° ° °

## 2024-04-18 NOTE — ED Triage Notes (Addendum)
 Reports vomiting for 2 days.  States vomiting is slowing down today .patient is taking in liquids today and is able to hold liquids down today reports general abdominal cramping.  Patient reports first day of menstrual cycle was one week ago. Denies urinary symptoms Denies diarrhea.    Requesting sugar be checked

## 2024-04-19 NOTE — ED Provider Notes (Signed)
 " [facility]   [id number] 04/18/24 Arrival Time: 1435  ASSESSMENT & PLAN:  1. Gastroenteritis   2. Nausea and vomiting, unspecified vomiting type    Feels that she has improved greatly today. Tolerating PO intake. Prefers home observation. If needed: Meds ordered this encounter  Medications   ondansetron  (ZOFRAN -ODT) 4 MG disintegrating tablet    Sig: Take 1 tablet (4 mg total) by mouth every 8 (eight) hours as needed for nausea or vomiting.    Dispense:  15 tablet    Refill:  0    Reviewed expectations re: course of current medical issues. Questions answered. Outlined signs and symptoms indicating need for more acute intervention. Patient verbalized understanding. After Visit Summary given.   SUBJECTIVE: History from: patient.  Sierra Daniels is a 29 y.o. female who presents with complaint of non-bilious, non-bloody n/v; mostly yesterday; few loose stools. Mild abd cramping. Feeling better today.  Patient's last menstrual period was 04/13/2024 (approximate).  Past Surgical History:  Procedure Laterality Date   CESAREAN SECTION MULTI-GESTATIONAL N/A 05/05/2022   Procedure: CESAREAN SECTION MULTI-GESTATIONAL;  Surgeon: Barbra Lang PARAS, DO;  Location: [facility] LD ORS;  Service: Obstetrics;  Laterality: N/A;   NO PAST SURGERIES      ROS: As per HPI.  OBJECTIVE:  Vitals:   04/18/24 1615  BP: 112/70  Pulse: 78  Temp: 98.1 F (36.7 C)  TempSrc: Oral  SpO2: 98%    General appearance: alert; no distress Oropharynx: moiist Lungs: clear to auscultation bilaterally; unlabored Heart: regular rate and rhythm Abdomen: soft; non-distended; no significant abdominal tenderness; reports cramping feeling; bowel sounds present; no masses or organomegaly; no guarding or rebound tenderness Back: no CVA tenderness Extremities: no edema; symmetrical with no gross deformities Skin: warm; dry Neurologic: normal gait Psychological: alert and cooperative; normal mood and  affect  Labs: Results for orders placed or performed during the hospital encounter of 04/18/24  POCT glucose (manual entry)   Collection Time: 04/18/24  4:56 PM  Result Value Ref Range   POCT Glucose ([facility]) 147 (A) 70 - 99 mg/dL  POC Urinalysis Dipstick   Collection Time: 04/18/24  5:12 PM  Result Value Ref Range   Color, UA yellow yellow   Clarity, UA clear clear   Glucose, UA negative negative mg/dL   Bilirubin, UA negative negative   Ketones, POC UA negative negative mg/dL   Spec Grav, UA 8.974 8.989 - 1.025   Blood, UA trace-intact (A) negative   pH, UA 5.5 5.0 - 8.0   POC PROTEIN,UA negative negative, trace   Urobilinogen, UA 0.2 0.2 or 1.0 E.U./dL   Nitrite, UA Negative Negative   Leukocytes, UA Trace (A) Negative  POCT urine pregnancy   Collection Time: 04/18/24  5:13 PM  Result Value Ref Range   Preg Test, Ur Negative Negative   Labs Reviewed  POCT URINE DIPSTICK - Abnormal; Notable for the following components:      Result Value   Blood, UA trace-intact (*)    Leukocytes, UA Trace (*)    All other components within normal limits  GLUCOSE, POCT (MANUAL RESULT ENTRY) - Abnormal; Notable for the following components:   POCT Glucose ([facility]) 147 (*)    All other components within normal limits  POCT URINE PREGNANCY    Imaging: No results found.  Allergies[1]  Past Medical History:  Diagnosis Date   Gestational diabetes    metformin    Herpes 2018   Scoliosis    Social History   Socioeconomic History   Marital status: Single    Spouse name: Not on file   Number of children: Not on file   Years of education: Not on file   Highest education level: Not on file  Occupational History   Not on file  Tobacco Use   Smoking status: Some Days    Types: Cigars    Last attempt to quit: 08/02/2017    Years since quitting: 6.7   Smokeless tobacco: Never  Vaping Use   Vaping status: Never Used  Substance and Sexual  Activity   Alcohol use: Yes   Drug use: Not Currently    Types: Cocaine    Comment: last use in November 2023   Sexual activity: Not Currently    Birth control/protection: None, Surgical  Other Topics Concern   Not on file  Social History Narrative   Not on file   Social Drivers of Health   Tobacco Use: High Risk (04/18/2024)   Patient History    Smoking Tobacco Use: Some Days    Smokeless Tobacco Use: Never    Passive Exposure: Not on file  Financial Resource Strain: Not on file  Food Insecurity: No Food Insecurity (04/26/2022)   Hunger Vital Sign    Worried About Running Out of Food in the Last Year: Never true    Ran Out of Food in the Last Year: Never true  Transportation Needs: Unmet Transportation Needs (04/26/2022)   PRAPARE - Administrator, Civil Service (Medical): Yes    Lack of Transportation (Non-Medical): Yes  Physical Activity: Not on file  Stress: Not on file  Social Connections: Unknown (08/14/2021)   Received from [facility]   Social Network    Social Network: Not on file  Intimate Partner Violence: Unknown (07/07/2021)   Received from [facility]   HITS    Physically Hurt: Not on file    Insult or Talk Down To: Not on file    Threaten Physical Harm: Not on file    Scream or Curse: Not on file  Depression (PHQ2-9): Low Risk (06/09/2022)   Depression (PHQ2-9)    PHQ-2 Score: 2  Alcohol Screen: Not on file  Housing: Not on file  Utilities: Not on file  Health Literacy: Not on file   Family History  Problem Relation Age of Onset   Arthritis Mother    Diabetes Mother    Anxiety disorder Neg Hx    Alcohol abuse Neg Hx    ADD / ADHD Neg Hx    Asthma Neg Hx    Birth defects Neg Hx    Cancer Neg Hx    COPD Neg Hx    Depression Neg Hx    Drug abuse Neg Hx    Early death Neg Hx    Hearing loss Neg Hx    Heart disease Neg Hx    Hypertension Neg Hx    Hyperlipidemia Neg Hx    Intellectual disability Neg Hx    Kidney disease Neg Hx     Miscarriages / Stillbirths Neg Hx    Learning disabilities Neg Hx    Obesity Neg Hx    Stroke Neg Hx    Vision loss Neg Hx    Varicose Veins Neg Hx        [1] No Known Allergies  Rolinda Rogue, MD 04/19/24 [phone]  "
